# Patient Record
Sex: Male | Born: 2000 | Hispanic: No | Marital: Single | Smoking: Never smoker
Health system: Southern US, Community
[De-identification: ages and names within clinical notes are randomized; demographics above are authoritative.]

## PROBLEM LIST (undated history)

## (undated) DIAGNOSIS — S022XXA Fracture of nasal bones, initial encounter for closed fracture: Secondary | ICD-10-CM

## (undated) DIAGNOSIS — K219 Gastro-esophageal reflux disease without esophagitis: Secondary | ICD-10-CM

## (undated) DIAGNOSIS — J342 Deviated nasal septum: Secondary | ICD-10-CM

---
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
## 2020-01-25 ENCOUNTER — Encounter: Payer: Self-pay | Admitting: Nurse Practitioner

## 2020-01-25 ENCOUNTER — Ambulatory Visit: Payer: Medicaid Other | Attending: Nurse Practitioner | Admitting: Nurse Practitioner

## 2020-01-25 ENCOUNTER — Other Ambulatory Visit: Payer: Self-pay

## 2020-01-25 VITALS — BP 114/63 | HR 64 | Temp 97.7°F | Ht 68.0 in | Wt 146.0 lb

## 2020-01-25 DIAGNOSIS — Z0000 Encounter for general adult medical examination without abnormal findings: Secondary | ICD-10-CM

## 2020-01-25 DIAGNOSIS — M542 Cervicalgia: Secondary | ICD-10-CM

## 2020-01-25 DIAGNOSIS — M25519 Pain in unspecified shoulder: Secondary | ICD-10-CM | POA: Diagnosis not present

## 2020-01-25 DIAGNOSIS — J342 Deviated nasal septum: Secondary | ICD-10-CM

## 2020-01-25 MED ORDER — METHOCARBAMOL 500 MG PO TABS: 500.0000 mg | ORAL_TABLET | Freq: Three times a day (TID) | ORAL | 0 refills | Status: AC | PRN

## 2020-01-25 MED ORDER — IBUPROFEN 600 MG PO TABS: 600.0000 mg | ORAL_TABLET | Freq: Four times a day (QID) | ORAL | 0 refills | Status: DC | PRN

## 2020-01-25 MED ORDER — FLUTICASONE PROPIONATE 50 MCG/ACT NA SUSP: 2.0000 | Freq: Every day | NASAL | 6 refills | Status: DC

## 2020-01-25 NOTE — Progress Notes (Signed)
Assessment & Plan:  Roy Zavala was seen today for annual exam.  Diagnoses and all orders for this visit:  Encounter for annual physical exam  Deviated nasal septum -     Ambulatory referral to ENT -     CT Maxillofacial WO CM; Future -     fluticasone (FLONASE) 50 MCG/ACT nasal spray; Place 2 sprays into both nostrils daily.  Neck and shoulder pain -     methocarbamol (ROBAXIN) 500 MG tablet; Take 1 tablet (500 mg total) by mouth every 8 (eight) hours as needed for muscle spasms. -     ibuprofen (ADVIL) 600 MG tablet; Take 1 tablet (600 mg total) by mouth every 6 (six) hours as needed. Recommended professional massage therapy   Patient has been counseled on age-appropriate routine health concerns for screening and prevention. These are reviewed and up-to-date. Referrals have been placed accordingly. Immunizations are up-to-date or declined.    Subjective:   Chief Complaint  Patient presents with  . Annual Exam    Pt. is here for a physical. Pt. stated he is having right side neck pain that radiates down to his back. Pt. stated he had a nasal and facial surgery last year, and it's hard for him to breathe at night.    HPI Roy Zavala 19 y.o. male presents to office today for physical.  He has a history of nasal trauma and underwent closed reduction of nasal suture on 12-29-2016.   PER ENT NOTE On 05-20-2017  The patient has continued to have difficulty with left-sided nasal breathing, discolored nasal discharge and postnasal discharge with cough. No further trauma. His nasal congestion is significantly worse at night.The patient returns for follow-up appointment with continued symptoms of nasal airway obstruction, severe congestion and discolored postnasal discharge. Findings on exam are consistent with continued septal deviation and acute sinusitis. She prescribed Augmentin 500 twice daily times 10 days. He will use saline nasal spray and fluticasone. He will follow-up in 1 month for  recheck with CT scan of the sinuses first to rule out any further issues related to his prior injury and any chronic sinusitis.  Last week he states that he was playing soccer and got hit in the forehead and bridge of the nose. Had some bleeding but only on the tissue when he wiped. There was no profuse bleeding.    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 Onset of symptoms a few months ago, stable since that time. Current symptoms are aching and stiffness in the cervical region and bilateral trapezius. Patient denies occipital headaches. Patient has had no prior neck problems.  Previous treatments include: none.   Review of Systems  Constitutional: Negative for fever, malaise/fatigue and weight loss.  HENT: Positive for congestion (chronic nasal congestion). Negative for nosebleeds.   Eyes: Negative.  Negative for blurred vision, double vision and photophobia.  Respiratory: Negative.  Negative for cough and shortness of breath.   Cardiovascular: Negative.  Negative for chest pain, palpitations and leg swelling.  Gastrointestinal: Negative.  Negative for heartburn, nausea and vomiting.  Genitourinary: Negative.   Musculoskeletal: Positive for myalgias and neck pain.  Skin: Negative.   Neurological: Negative.  Negative for dizziness, focal weakness, seizures and headaches.  Endo/Heme/Allergies: Negative.   Psychiatric/Behavioral: Negative.  Negative for suicidal ideas.    Past  Medical History:  Diagnosis Date  . Acid reflux    occasional - no current med.  . Deviated septum 12/2016  . Nasal fracture 12/2016   nasal congestion due to fx., per father    Past Surgical History:  Procedure Laterality Date  . CLOSED REDUCTION NASAL FRACTURE N/A 12/29/2016   Procedure: CLOSED  REDUCTION NASAL FRACTURE WITH EXTERNAL FIXATION;  Surgeon: Osborn Coho, MD;  Location: [facility];  Service: ENT;  Laterality: N/A;    Family History  Problem Relation Age of Onset  . Hypertension Mother   . Diabetes Father   . Hearing loss Brother        due to trauma    Social History Reviewed with no changes to be made today.   Outpatient Medications Prior to Visit  Medication Sig Dispense Refill  . acetaminophen (TYLENOL) 500 MG tablet Take 1 tablet (500 mg total) by mouth every 6 (six) hours as needed. 30 tablet 0  . benzocaine (ORAJEL) 10 % mucosal gel Use as directed 1 application in the mouth or throat 4 (four) times daily as needed for mouth pain. Apply to mouth sore before meals (Patient not taking: Reported on 11/27/2019) 5.3 g 0  . benzonatate (TESSALON) 100 MG capsule Take 1 capsule (100 mg total) by mouth 3 (three) times daily as needed for cough. (Patient not taking: Reported on 11/27/2019) 20 capsule 0  . cetirizine (ZYRTEC) 10 MG tablet Take 1 tablet (10 mg total) by mouth daily. (Patient not taking: Reported on 11/27/2019) 30 tablet 2  . clindamycin-benzoyl peroxide (BENZACLIN) gel Apply topically daily. For acne (Patient not taking: Reported on 11/27/2019) 50 g 0  . ibuprofen (ADVIL) 600 MG tablet Take 1 tablet (600 mg total) by mouth every 6 (six) hours as needed. (Patient not taking: Reported on 11/27/2019) 30 tablet 0  . phenol (CHLORASEPTIC) 1.4 % LIQD Use as directed 1 spray in the mouth or throat as needed for throat irritation / pain. (Patient not taking: Reported on 11/27/2019) 29 mL 0  . tretinoin (RETIN-A) 0.05 % cream Apply topically at bedtime. (Patient not taking: Reported on 11/27/2019) 45 g 6   No facility-administered medications prior to visit.    No Known Allergies     Objective:    BP 114/63 (BP Location: Right Arm, Patient Position: Sitting, Cuff Size: Normal)   Pulse 64   Temp 97.7 F (36.5 C) (Temporal)   Ht 5\' 8"  (1.727 m)    Wt 146 lb (66.2 kg)   SpO2 95%   BMI 22.20 kg/m  Wt Readings from Last 3 Encounters:  01/25/20 146 lb (66.2 kg) (35 %, Z= -0.38)*  11/27/19 138 lb (62.6 kg) (23 %, Z= -0.74)*  05/10/19 140 lb (63.5 kg) (29 %, Z= -0.56)*   * Growth percentiles are based on CDC (Boys, 2-20 Years) data.    Physical Exam Vitals and nursing note reviewed.  Constitutional:      Appearance: He is well-developed.  HENT:     Head: Normocephalic and atraumatic.     Right Ear: Hearing, tympanic membrane, ear canal and external ear normal.     Left Ear: Hearing, tympanic membrane, ear canal and external ear normal.     Nose: Nasal deformity, septal deviation and mucosal edema present. No rhinorrhea.     Mouth/Throat:     Pharynx: Uvula midline.     Tonsils: No tonsillar exudate. 1+ on the right. 1+ on the left.  Eyes:     General:  Lids are normal. No scleral icterus.    Conjunctiva/sclera: Conjunctivae normal.     Pupils: Pupils are equal, round, and reactive to light.  Neck:     Thyroid: No thyromegaly.     Trachea: No tracheal deviation.  Cardiovascular:     Rate and Rhythm: Normal rate and regular rhythm.     Heart sounds: Normal heart sounds. No murmur heard.  No friction rub. No gallop.   Pulmonary:     Effort: Pulmonary effort is normal. No tachypnea or respiratory distress.     Breath sounds: Normal breath sounds. No decreased breath sounds, wheezing, rhonchi or rales.  Chest:     Chest wall: No mass or tenderness.     Breasts:        Right: No inverted nipple, mass, nipple discharge, skin change or tenderness.        Left: No inverted nipple, mass, nipple discharge, skin change or tenderness.  Abdominal:     General: Bowel sounds are normal. There is no distension.     Palpations: Abdomen is soft. There is no mass.     Tenderness: There is no abdominal tenderness. There is no guarding or rebound.     Hernia: There is no hernia in the left inguinal area.  Genitourinary:    Penis: Normal.        Testes: Normal.        Right: Mass, tenderness or swelling not present. Right testis is descended. Cremasteric reflex is present.         Left: Mass, tenderness or swelling not present. Left testis is descended. Cremasteric reflex is present.   Musculoskeletal:        General: No tenderness or deformity. Normal range of motion.     Cervical back: Normal range of motion and neck supple. Muscular tenderness present.  Lymphadenopathy:     Cervical: No cervical adenopathy.     Lower Body: No right inguinal adenopathy. No left inguinal adenopathy.  Skin:    General: Skin is warm and dry.     Capillary Refill: Capillary refill takes less than 2 seconds.     Findings: No erythema.  Neurological:     Mental Status: He is alert and oriented to person, place, and time.     Cranial Nerves: No cranial nerve deficit.     Motor: No abnormal muscle tone.     Coordination: Coordination normal.     Deep Tendon Reflexes: Reflexes normal.  Psychiatric:        Behavior: Behavior normal. Behavior is cooperative.        Thought Content: Thought content normal.        Judgment: Judgment normal.          Patient has been counseled extensively about nutrition and exercise as well as the importance of adherence with medications and regular follow-up. The patient was given clear instructions to go to ER or return to medical center if symptoms don't improve, worsen or new problems develop. The patient verbalized understanding.   Follow-up: Return if symptoms worsen or fail to improve.   Claiborne Rigg, FNP-BC [facility] and [facility] [place], [place] [phone]   01/27/2020,

## 2020-01-25 NOTE — Patient Instructions (Addendum)
[facility] [phone] [address] [place], [place] [address] [email or web address] Phone: [phone]

## 2020-01-27 ENCOUNTER — Encounter: Payer: Self-pay | Admitting: Nurse Practitioner

## 2020-02-06 ENCOUNTER — Telehealth: Payer: Self-pay

## 2020-02-06 NOTE — Telephone Encounter (Signed)
This pt has been approved for CPT code: 30865 (CT Maxillofacial w/o contrast). Auth code: [id number]. Auth good 02/06/20-03/22/20.

## 2020-02-08 ENCOUNTER — Ambulatory Visit (HOSPITAL_COMMUNITY): Payer: Medicaid Other

## 2020-02-15 ENCOUNTER — Ambulatory Visit (HOSPITAL_COMMUNITY)
Admission: RE | Admit: 2020-02-15 | Discharge: 2020-02-15 | Disposition: A | Discharge status: Home / Self Care | Payer: Medicaid Other | Source: Ambulatory Visit | Attending: Nurse Practitioner | Admitting: Nurse Practitioner

## 2020-02-15 DIAGNOSIS — J342 Deviated nasal septum: Secondary | ICD-10-CM | POA: Diagnosis present

## 2020-02-21 DIAGNOSIS — J343 Hypertrophy of nasal turbinates: Secondary | ICD-10-CM | POA: Diagnosis not present

## 2020-02-21 DIAGNOSIS — J342 Deviated nasal septum: Secondary | ICD-10-CM | POA: Diagnosis not present

## 2020-02-21 DIAGNOSIS — J310 Chronic rhinitis: Secondary | ICD-10-CM | POA: Diagnosis not present

## 2020-03-30 ENCOUNTER — Other Ambulatory Visit: Payer: Self-pay

## 2020-03-30 ENCOUNTER — Emergency Department (HOSPITAL_COMMUNITY)
Admission: EM | Admit: 2020-03-30 | Discharge: 2020-03-30 | Disposition: A | Discharge status: Home / Self Care | Payer: Medicaid Other | Attending: Emergency Medicine | Admitting: Emergency Medicine

## 2020-03-30 ENCOUNTER — Encounter (HOSPITAL_COMMUNITY): Payer: Self-pay | Admitting: Emergency Medicine

## 2020-03-30 DIAGNOSIS — B86 Scabies: Secondary | ICD-10-CM | POA: Insufficient documentation

## 2020-03-30 DIAGNOSIS — R21 Rash and other nonspecific skin eruption: Secondary | ICD-10-CM | POA: Diagnosis present

## 2020-03-30 MED ORDER — PERMETHRIN 5 % EX CREA: TOPICAL_CREAM | CUTANEOUS | 1 refills | Status: DC

## 2020-03-30 NOTE — ED Provider Notes (Signed)
[name] Arrival date & time: 03/30/20  1517     History Chief Complaint  Patient presents with  . Rash    Roy Zavala is a 19 y.o. male.  Roy Zavala is a 19 y.o. male with a history of acid reflux, who presents to the emergency department for evaluation of rash.  Patient reports that rash showed up on 10/23 after he had stayed at a hotel in [facility].  At first he thought this was related to seafood that he had eaten, but he states that he first noticed the rash after sleeping in the hotel bed.  It started on his hands and feet and around his waist and has spread up his arms and the legs and onto his trunk he is also noticed a few lesions on his face.  He reports rash is extremely itchy, denies any pain.  No vesicles or pustules.  No fevers or chills.  No associated facial swelling, difficulty breathing.  Rash has persisted, he reports that the person he was within the hotel does not have a similar rash but slept in a different bed.  Did not see any bugs on himself.  No other aggravating or alleviating factors.        Past Medical History:  Diagnosis Date  . Acid reflux    occasional - no current med.  . Deviated septum 12/2016  . Nasal fracture 12/2016   nasal congestion due to fx., per father    Patient Active Problem List   Diagnosis Date Noted  . Severe left groin pain 09/12/2017  . Closed fracture of nasal bone 12/29/2016  . Deviated septum 12/29/2016  . Tear of left hamstring 02/11/2016  . Knee pain, bilateral 05/21/2015  . Vitamin D deficiency 09/13/2014  . Failed vision screen 03/13/2014  . Episodic lightheadedness 04/16/2013    Past Surgical History:  Procedure Laterality Date  . CLOSED REDUCTION NASAL FRACTURE N/A 12/29/2016   Procedure: CLOSED REDUCTION NASAL FRACTURE WITH EXTERNAL FIXATION;  Surgeon: Osborn Coho, MD;  Location: [facility];  Service: ENT;   Laterality: N/A;       Family History  Problem Relation Age of Onset  . Hypertension Mother   . Diabetes Father   . Hearing loss Brother        due to trauma    Social History   Tobacco Use  . Smoking status: Never Smoker  . Smokeless tobacco: Never Used  Vaping Use  . Vaping Use: Never used  Substance Use Topics  . Alcohol use: No  . Drug use: No    Home Medications Prior to Admission medications   Medication Sig Start Date End Date Taking? Authorizing Provider  acetaminophen (TYLENOL) 500 MG tablet Take 1 tablet (500 mg total) by mouth every 6 (six) hours as needed. 07/27/19   Fawze, Mina A, PA-C  fluticasone (FLONASE) 50 MCG/ACT nasal spray Place 2 sprays into both nostrils daily. 01/25/20   Claiborne Rigg, NP  ibuprofen (ADVIL) 600 MG tablet Take 1 tablet (600 mg total) by mouth every 6 (six) hours as needed. 01/25/20   Claiborne Rigg, NP  permethrin Verner Mould) 5 % cream Apply to affected area once 03/30/20   Dartha Lodge, PA-C    Allergies    Patient has no known allergies.  Review of Systems   Review of Systems  Constitutional: Negative for chills and fever.  HENT: Negative for facial swelling.  Skin: Positive for rash.    Physical Exam Updated Vital Signs BP 127/81 (BP Location: Left Arm)   Pulse (!) 56   Temp 98.4 F (36.9 C) (Oral)   Resp 16   SpO2 93%   Physical Exam Vitals and nursing note reviewed.  Constitutional:      General: He is not in acute distress.    Appearance: Normal appearance. He is well-developed. He is not ill-appearing or diaphoretic.     Comments: Well-appearing and in no distress  HENT:     Head: Normocephalic and atraumatic.     Mouth/Throat:     Mouth: Mucous membranes are moist.     Pharynx: Oropharynx is clear.     Comments: No mucosal lesions Eyes:     General:        Right eye: No discharge.        Left eye: No discharge.  Cardiovascular:     Rate and Rhythm: Normal rate and regular rhythm.     Heart  sounds: Normal heart sounds.  Pulmonary:     Effort: Pulmonary effort is normal. No respiratory distress.     Breath sounds: Normal breath sounds.  Musculoskeletal:     Cervical back: Neck supple.  Skin:    General: Skin is warm and dry.     Comments: Rash consisting of small erythematous papular lesions spreading from the hands and feet up the extremities, patient also with lesions on the trunk, few lesions to the forehead and neck noted as well.  Several of these lesions are in a linear formation.  Rash consistent with scabies  Neurological:     Mental Status: He is alert.     Coordination: Coordination normal.  Psychiatric:        Mood and Affect: Mood normal.        Behavior: Behavior normal.     ED Results / Procedures / Treatments   Labs (all labs ordered are listed, but only abnormal results are displayed) Labs Reviewed - No data to display  EKG None  Radiology No results found.  Procedures Procedures (including critical care time)  Medications Ordered in ED Medications - No data to display  ED Course  I have reviewed the triage vital signs and the nursing notes.  Pertinent labs & imaging results that were available during my care of the patient were reviewed by me and considered in my medical decision making (see chart for details).    MDM Rules/Calculators/A&P                         Discussed diagnosis & treatment of scabies with patient.  Advised to followup with her primary care doctor 2 weeks after treatment.  They have also been advised to clean entire household including washing sheets and using Nix spray On things that cannot be washed.   The use of permethrin cream was discussed as well, told to use cream from head to toe & leave on  for 8-12 hours.  Pt advised to repeat treatment if new eruptions occur. Patient verbalized understanding.  Discharged home in good condition.  Final Clinical Impression(s) / ED Diagnoses Final diagnoses:  Scabies    Rx /  DC Orders ED Discharge Orders         Ordered    permethrin (ELIMITE) 5 % cream        03/30/20 1836           Dartha Lodge, PA-C  03/30/20 2331    Little, Ambrose Finland, MD 03/31/20 1443

## 2020-03-30 NOTE — ED Triage Notes (Signed)
C/o itchy bumps all over since staying in a hotel in [facility] this past week.

## 2020-03-30 NOTE — Discharge Instructions (Addendum)
Wash all clothing & linens in hot water.  Spray all upholstered surfaces with Nix spray.  You should treat yourself with the prescribed cream as well as anyone else who lives in the house with you and was also in the same hotel.  Be sure the cream is on the skin for at least 8 hours.  Most people sleep in it over night & wash it off in the morning. You may repeat treatment in 1 week if there is no improvement.

## 2020-03-31 ENCOUNTER — Telehealth: Payer: Self-pay

## 2020-03-31 DIAGNOSIS — Z01818 Encounter for other preprocedural examination: Secondary | ICD-10-CM | POA: Diagnosis not present

## 2020-03-31 NOTE — Telephone Encounter (Signed)
Transition Care Management Follow-up Telephone Call  Date of discharge and from where: 03/30/2020 [facility]  How have you been since you were released from the hospital? Doing better,   Any questions or concerns? No  Items Reviewed:  Did the pt receive and understand the discharge instructions provided? Yes   Medications obtained and verified? Yes   Other? No   Any new allergies since your discharge? No   Dietary orders reviewed? Yes  Do you have support at home? Yes   Home Care and Equipment/Supplies: Were home health services ordered? not applicable If so, what is the name of the agency? na  Has the agency set up a time to come to the patient's home? not applicable Were any new equipment or medical supplies ordered?  No What is the name of the medical supply agency? na Were you able to get the supplies/equipment? not applicable Do you have any questions related to the use of the equipment or supplies? No  Functional Questionnaire: (I = Independent and D = Dependent) ADLs: I  Bathing/Dressing- I  Meal Prep- I  Eating- I  Maintaining continence- I  Transferring/Ambulation- I  Managing Meds- I  Follow up appointments reviewed:   PCP Hospital f/u appt confirmed? No    Specialist Hospital f/u appt confirmed? No    Are transportation arrangements needed? No   If their condition worsens, is the pt aware to call PCP or go to the Emergency Dept.? Yes  Was the patient provided with contact information for the PCP's office or ED? Yes  Was to pt encouraged to call back with questions or concerns? Yes

## 2020-04-03 DIAGNOSIS — J343 Hypertrophy of nasal turbinates: Secondary | ICD-10-CM | POA: Diagnosis not present

## 2020-04-03 DIAGNOSIS — J3489 Other specified disorders of nose and nasal sinuses: Secondary | ICD-10-CM | POA: Diagnosis not present

## 2020-04-03 DIAGNOSIS — J342 Deviated nasal septum: Secondary | ICD-10-CM | POA: Diagnosis not present

## 2020-04-04 DIAGNOSIS — L7622 Postprocedural hemorrhage and hematoma of skin and subcutaneous tissue following other procedure: Secondary | ICD-10-CM | POA: Diagnosis not present

## 2020-05-01 ENCOUNTER — Ambulatory Visit (INDEPENDENT_AMBULATORY_CARE_PROVIDER_SITE_OTHER): Payer: Medicaid Other | Admitting: Family Medicine

## 2020-05-01 ENCOUNTER — Other Ambulatory Visit: Payer: Self-pay

## 2020-05-01 DIAGNOSIS — M545 Low back pain, unspecified: Secondary | ICD-10-CM | POA: Diagnosis not present

## 2020-05-01 MED ORDER — MELOXICAM 15 MG PO TABS: 7.5000 mg | ORAL_TABLET | Freq: Every day | ORAL | 6 refills | Status: DC | PRN

## 2020-05-01 NOTE — Progress Notes (Signed)
Office Visit Note   Patient: Roy Zavala           Date of Birth: Jun 11, 2000           MRN: [id number] Visit Date: 05/01/2020 Requested by: Claiborne Rigg, NP [address] [place],  [place] [address] PCP: Claiborne Rigg, NP  Subjective: Chief Complaint  Patient presents with  . Lower Back - Pain    Pain in middle of lower back x 1 month - constant. Unsure of exact injury, but plays soccer and works out with [employer]. Notices pain mostly with bending forward at the waist and with picking up objects, as well as with soccer.   . Left Shoulder - Pain    Pain in posterior shoulder - "tightness" in the muscles with playing sports x 1 month. Tried stretching and using icy/hot otc. Pain and tightness is still there.    HPI: He is here with low back pain.  Symptoms started about a month ago.  He plays competitive soccer and also lifts weights.  He is not sure exactly what caused his pain but he has had some pain during both activities.  Pain does not radiate down the legs but occasionally radiates upward toward his thoracic area.  The pain seems to be in the midline more in the lumbar region.  No bowel or bladder dysfunction.  He has tried stretching at home with some temporary relief.  He is not taking medications, he has never had problems with his back before.                ROS:   All other systems were reviewed and are negative.  Objective: Vital Signs: There were no vitals taken for this visit.  Physical Exam:  General:  Alert and oriented, in no acute distress. Pulm:  Breathing unlabored. Psy:  Normal mood, congruent affect. Skin: No rash Low back: He has a tender trigger point to the right of midline at about 1-2.  He is also tender in the lumbosacral area but not severely.  Stork test is equivocal, straight leg raise is negative.  No tenderness over the SI joint.  Lower extremity strength and reflexes are normal.    Imaging: No results found.  Assessment & Plan: 1.   Acute low back pain, suspect myofascial pain -Referral to [facility] PT.  Meloxicam as needed.  X-rays and MRI scan if he fails to improve.     Procedures: No procedures performed        PMFS History: Patient Active Problem List   Diagnosis Date Noted  . Severe left groin pain 09/12/2017  . Closed fracture of nasal bone 12/29/2016  . Deviated septum 12/29/2016  . Tear of left hamstring 02/11/2016  . Knee pain, bilateral 05/21/2015  . Vitamin D deficiency 09/13/2014  . Failed vision screen 03/13/2014  . Episodic lightheadedness 04/16/2013   Past Medical History:  Diagnosis Date  . Acid reflux    occasional - no current med.  . Deviated septum 12/2016  . Nasal fracture 12/2016   nasal congestion due to fx., per father    Family History  Problem Relation Age of Onset  . Hypertension Mother   . Diabetes Father   . Hearing loss Brother        due to trauma    Past Surgical History:  Procedure Laterality Date  . CLOSED REDUCTION NASAL FRACTURE N/A 12/29/2016   Procedure: CLOSED REDUCTION NASAL FRACTURE WITH EXTERNAL FIXATION;  Surgeon: Osborn Coho,  MD;  Location: [facility];  Service: ENT;  Laterality: N/A;   Social History   Occupational History  . Not on file  Tobacco Use  . Smoking status: Never Smoker  . Smokeless tobacco: Never Used  Vaping Use  . Vaping Use: Never used  Substance and Sexual Activity  . Alcohol use: No  . Drug use: No  . Sexual activity: Never

## 2020-05-14 DIAGNOSIS — Z20822 Contact with and (suspected) exposure to covid-19: Secondary | ICD-10-CM | POA: Diagnosis not present

## 2020-08-11 ENCOUNTER — Ambulatory Visit: Payer: Medicaid Other | Admitting: Family Medicine

## 2020-08-14 ENCOUNTER — Ambulatory Visit: Payer: Medicaid Other | Admitting: Family Medicine

## 2020-08-25 ENCOUNTER — Other Ambulatory Visit: Payer: Self-pay

## 2020-08-25 ENCOUNTER — Ambulatory Visit (INDEPENDENT_AMBULATORY_CARE_PROVIDER_SITE_OTHER): Payer: Medicaid Other | Admitting: Family Medicine

## 2020-08-25 ENCOUNTER — Encounter: Payer: Self-pay | Admitting: Family Medicine

## 2020-08-25 DIAGNOSIS — G8929 Other chronic pain: Secondary | ICD-10-CM | POA: Diagnosis not present

## 2020-08-25 DIAGNOSIS — M545 Low back pain, unspecified: Secondary | ICD-10-CM | POA: Diagnosis not present

## 2020-08-25 MED ORDER — DICLOFENAC SODIUM 75 MG PO TBEC: 75.0000 mg | DELAYED_RELEASE_TABLET | Freq: Two times a day (BID) | ORAL | 3 refills | Status: DC | PRN

## 2020-08-25 MED ORDER — TIZANIDINE HCL 2 MG PO TABS: 2.0000 mg | ORAL_TABLET | Freq: Four times a day (QID) | ORAL | 1 refills | Status: DC | PRN

## 2020-08-25 NOTE — Progress Notes (Signed)
   Office Visit Note   Patient: Roy Zavala           Date of Birth: [birth year]           MRN: [id number] Visit Date: 08/25/2020 Requested by: Claiborne Rigg, NP [address] [place],  [place] [address] PCP: Claiborne Rigg, NP  Subjective: Chief Complaint  Patient presents with  . Lower Back - Pain    Continues to have pain in the lower back. Constant. Cannot bend over and stretch. Has not been playing any sports.     HPI: He is here with persistent back pain.  He is actually gotten worse since last visit.  He is doing a lot of home exercises but nothing seems to help, now he cannot bend forward when standing, or sits on the floor with his family.  He has to sleep with a towel underneath his back.  No radicular pain.  Meloxicam did not help.               ROS:   All other systems were reviewed and are negative.  Objective: Vital Signs: There were no vitals taken for this visit.  Physical Exam:  General:  Alert and oriented, in no acute distress. Pulm:  Breathing unlabored. Psy:  Normal mood, congruent affect.  Low back: He is tender now near the L4-5 level in the paraspinous muscles and a little bit over the spinous processes.  Straight leg raise negative, lower extremity strength and reflexes remain normal.   Imaging: No results found.  Assessment & Plan: 1.  Worsening low back pain -Discussed options and elected to proceed with x-rays and MRI scan.  Depending on the results, consider physical therapy. -Called in diclofenac and tizanidine to take as needed.     Procedures: No procedures performed        PMFS History: Patient Active Problem List   Diagnosis Date Noted  . Severe left groin pain 09/12/2017  . Acute recurrent pansinusitis 05/20/2017  . Rhinitis, chronic 05/20/2017  . Closed fracture of nasal bone 12/29/2016  . Deviated septum 12/29/2016  . Tear of left hamstring 02/11/2016  . Knee pain, bilateral 05/21/2015  . Vitamin D deficiency  09/13/2014  . Failed vision screen 03/13/2014  . Episodic lightheadedness 04/16/2013   Past Medical History:  Diagnosis Date  . Acid reflux    occasional - no current med.  . Deviated septum 12/2016  . Nasal fracture 12/2016   nasal congestion due to fx., per father    Family History  Problem Relation Age of Onset  . Hypertension Mother   . Diabetes Father   . Hearing loss Brother        due to trauma    Past Surgical History:  Procedure Laterality Date  . CLOSED REDUCTION NASAL FRACTURE N/A 12/29/2016   Procedure: CLOSED REDUCTION NASAL FRACTURE WITH EXTERNAL FIXATION;  Surgeon: Osborn Coho, MD;  Location: [facility];  Service: ENT;  Laterality: N/A;   Social History   Occupational History  . Not on file  Tobacco Use  . Smoking status: Never Smoker  . Smokeless tobacco: Never Used  Vaping Use  . Vaping Use: Never used  Substance and Sexual Activity  . Alcohol use: No  . Drug use: No  . Sexual activity: Never

## 2020-09-13 ENCOUNTER — Ambulatory Visit (HOSPITAL_BASED_OUTPATIENT_CLINIC_OR_DEPARTMENT_OTHER)
Admission: RE | Admit: 2020-09-13 | Discharge: 2020-09-13 | Disposition: A | Discharge status: Home / Self Care | Payer: Medicaid Other | Source: Ambulatory Visit | Attending: Family Medicine | Admitting: Family Medicine

## 2020-09-13 ENCOUNTER — Other Ambulatory Visit: Payer: Self-pay

## 2020-09-13 DIAGNOSIS — M545 Low back pain, unspecified: Secondary | ICD-10-CM | POA: Insufficient documentation

## 2020-09-13 DIAGNOSIS — G8929 Other chronic pain: Secondary | ICD-10-CM | POA: Diagnosis present

## 2020-09-15 ENCOUNTER — Telehealth: Payer: Self-pay | Admitting: Family Medicine

## 2020-09-15 DIAGNOSIS — G8929 Other chronic pain: Secondary | ICD-10-CM

## 2020-09-15 DIAGNOSIS — M545 Low back pain, unspecified: Secondary | ICD-10-CM

## 2020-09-15 NOTE — Telephone Encounter (Signed)
MRI looks normal.  No disc problems, bone problems, or pinched nerves.   Pain is most likely muscular.  Would suggest going to physical therapy for treatment.  Will place orders for this.

## 2020-09-15 NOTE — Telephone Encounter (Signed)
LMOM of the below message from Dr. Prince Rome

## 2020-10-01 ENCOUNTER — Ambulatory Visit: Payer: Medicaid Other | Attending: Family Medicine | Admitting: Physical Therapy

## 2020-10-01 DIAGNOSIS — M5441 Lumbago with sciatica, right side: Secondary | ICD-10-CM | POA: Insufficient documentation

## 2020-10-01 DIAGNOSIS — R29898 Other symptoms and signs involving the musculoskeletal system: Secondary | ICD-10-CM | POA: Insufficient documentation

## 2020-10-10 ENCOUNTER — Other Ambulatory Visit: Payer: Self-pay

## 2020-10-10 ENCOUNTER — Ambulatory Visit: Payer: Medicaid Other | Attending: Nurse Practitioner | Admitting: Nurse Practitioner

## 2020-10-10 ENCOUNTER — Encounter: Payer: Self-pay | Admitting: Nurse Practitioner

## 2020-10-10 DIAGNOSIS — M545 Low back pain, unspecified: Secondary | ICD-10-CM

## 2020-10-10 DIAGNOSIS — G8929 Other chronic pain: Secondary | ICD-10-CM | POA: Diagnosis not present

## 2020-10-10 DIAGNOSIS — E559 Vitamin D deficiency, unspecified: Secondary | ICD-10-CM | POA: Diagnosis not present

## 2020-10-10 MED ORDER — GABAPENTIN 600 MG PO TABS: 300.0000 mg | ORAL_TABLET | Freq: Every day | ORAL | 0 refills | Status: AC

## 2020-10-10 MED ORDER — VITAMIN D (ERGOCALCIFEROL) 1.25 MG (50000 UNIT) PO CAPS: 50000.0000 [IU] | ORAL_CAPSULE | ORAL | 1 refills | Status: AC

## 2020-10-10 MED ORDER — LIDOCAINE 5 % EX PTCH: 1.0000 | MEDICATED_PATCH | CUTANEOUS | 0 refills | Status: AC

## 2020-10-10 NOTE — Progress Notes (Signed)
Virtual Visit via Telephone Note Due to national recommendations of social distancing due to COVID 19, telehealth visit is felt to be most appropriate for this patient at this time.  I discussed the limitations, risks, security and privacy concerns of performing an evaluation and management service by telephone and the availability of in person appointments. I also discussed with the patient that there may be a patient responsible charge related to this service. The patient expressed understanding and agreed to proceed.    I connected with Roy Zavala on 10/10/20  at  10:50 AM EDT  EDT by telephone and verified that I am speaking with the correct person using two identifiers.  Location of Patient: Private Residence   Location of Provider: [facility]    Persons participating in Telemedicine visit: Bertram Denver FNP-BC Roy Zavala    History of Present Illness: Telemedicine visit for: Low back pain  He has been seeing Dr. Prince Rome with Ortho for his chronic low back pain. Imaging did not show any specific abnormalities that should be causing his reported pain. Today he states his pain has not improved. He will start seeing PT  next week. Onset 6 months ago. Treatments tried: yoga, massage, stretching, OTC NSAIDs, meloxicam with little relief of pain.   He has not been able to play competitive soccer since his pain started. Pain does not radiate down the legs but occasionally radiates upward toward his thoracic area.  The pain seems to be in the midline more in the lumbar region.  No bowel or bladder dysfunction.    Past Medical History:  Diagnosis Date   Acid reflux    occasional - no current med.   Deviated septum 12/2016   Nasal fracture 12/2016   nasal congestion due to fx., per father    Past Surgical History:  Procedure Laterality Date   CLOSED REDUCTION NASAL FRACTURE N/A 12/29/2016   Procedure: CLOSED REDUCTION NASAL FRACTURE WITH EXTERNAL  FIXATION;  Surgeon: Osborn Coho, MD;  Location: [facility];  Service: ENT;  Laterality: N/A;    Family History  Problem Relation Age of Onset   Hypertension Mother    Diabetes Father    Hearing loss Brother        due to trauma    Social History   Socioeconomic History   Marital status: Single    Spouse name: Not on file   Number of children: Not on file   Years of education: Not on file   Highest education level: Not on file  Occupational History   Not on file  Tobacco Use   Smoking status: Never   Smokeless tobacco: Never  Vaping Use   Vaping Use: Never used  Substance and Sexual Activity   Alcohol use: No   Drug use: No   Sexual activity: Never  Other Topics Concern   Not on file  Social History Narrative   Not on file   Social Determinants of Health   Financial Resource Strain: Not on file  Food Insecurity: Not on file  Transportation Needs: Not on file  Physical Activity: Not on file  Stress: Not on file  Social Connections: Not on file     Observations/Objective: Awake, alert and oriented x 3   Review of Systems  Constitutional:  Negative for fever, malaise/fatigue and weight loss.  HENT: Negative.  Negative for nosebleeds.   Eyes: Negative.  Negative for blurred vision, double vision and photophobia.  Respiratory: Negative.  Negative for  cough and shortness of breath.   Cardiovascular: Negative.  Negative for chest pain, palpitations and leg swelling.  Gastrointestinal: Negative.  Negative for heartburn, nausea and vomiting.  Musculoskeletal:  Positive for back pain and myalgias.  Neurological: Negative.  Negative for dizziness, focal weakness, seizures and headaches.  Psychiatric/Behavioral: Negative.  Negative for suicidal ideas.    Assessment and Plan: Diagnoses and all orders for this visit:  Chronic bilateral low back pain without sciatica -     lidocaine (LIDODERM) 5 %; Place 1 patch onto the skin daily. Remove & Discard  patch within 12 hours or as directed by MD -     gabapentin (NEURONTIN) 600 MG tablet; Take 0.5 tablets (300 mg total) by mouth at bedtime. TAKE ONLY HALF A TABLET Follow up with physical therapy as scheduled. If no improvement will need f/u with ortho. Would like new referral  Vitamin D deficiency -     Vitamin D, Ergocalciferol, (DRISDOL) 1.25 MG (50000 UNIT) CAPS capsule; Take 1 capsule (50,000 Units total) by mouth every 7 (seven) days.    Follow Up Instructions Return if symptoms worsen or fail to improve.     I discussed the assessment and treatment plan with the patient. The patient was provided an opportunity to ask questions and all were answered. The patient agreed with the plan and demonstrated an understanding of the instructions.   The patient was advised to call back or seek an in-person evaluation if the symptoms worsen or if the condition fails to improve as anticipated.  I provided 15 minutes of non-face-to-face time during this encounter including median intraservice time, reviewing previous notes, labs, imaging, medications and explaining diagnosis and management.  Claiborne Rigg, FNP-BC

## 2020-10-14 ENCOUNTER — Encounter: Payer: Self-pay | Admitting: Physical Therapy

## 2020-10-14 ENCOUNTER — Ambulatory Visit: Payer: Medicaid Other | Admitting: Physical Therapy

## 2020-10-14 ENCOUNTER — Other Ambulatory Visit: Payer: Self-pay

## 2020-10-14 DIAGNOSIS — M5441 Lumbago with sciatica, right side: Secondary | ICD-10-CM

## 2020-10-14 DIAGNOSIS — R29898 Other symptoms and signs involving the musculoskeletal system: Secondary | ICD-10-CM

## 2020-10-14 NOTE — Therapy (Addendum)
[facility] [address]  [place], [place], [address] Phone: [phone]   Fax:  [phone]  Physical Therapy Evaluation  Patient Details  Name: Roy Zavala MRN: [id number] Date of Birth: 10/10/2000 Referring Provider (PT): Eunice Blase, MD   Encounter Date: 10/14/2020   PT End of Session - 10/14/20 1548     Visit Number 1    Number of Visits 13    Date for PT Re-Evaluation 11/25/20    Authorization Type [employer] Medicaid    PT Start Time 1194    PT Stop Time 1537    PT Time Calculation (min) 48 min    Activity Tolerance Patient limited by pain;Patient tolerated treatment well    Behavior During Therapy [facility] for tasks assessed/performed             Past Medical History:  Diagnosis Date   Acid reflux    occasional - no current med.   Deviated septum 12/2016   Nasal fracture 12/2016   nasal congestion due to fx., per father    Past Surgical History:  Procedure Laterality Date   CLOSED REDUCTION NASAL FRACTURE N/A 12/29/2016   Procedure: CLOSED REDUCTION NASAL FRACTURE WITH EXTERNAL FIXATION;  Surgeon: Jerrell Belfast, MD;  Location: [facility];  Service: ENT;  Laterality: N/A;    There were no vitals filed for this visit.    Subjective Assessment - 10/14/20 1451     Subjective Patient reports 2 months of LBP without specific injury.  Starting last week he noticed radiation down his R leg to the knee. Notes some N/T in the R buttock. Denies B&B changes. Notes that when he bends forward he has trouble getting back up, unable to do yoga, or sprint. Better with muscle relaxants and laying prone. Reports that he had a soccer game yesterday and when he pivoted to get the ball, felt a pop in his back and felt that he could no longer bend his back forward.    Pertinent History none    Limitations Sitting;Lifting;House hold activities    How long can you sit comfortably? 0 minutes    How long can  you stand comfortably? unlimited    How long can you walk comfortably? unlimited    Diagnostic tests 09/13/20 lumbar MRI: Normal MRI of the lumbar spine.    Patient Stated Goals decrease pain    Currently in Pain? Yes    Pain Score 9     Pain Location Back    Pain Orientation Right;Lower    Pain Descriptors / Indicators Tightness    Pain Type Acute pain                [facility] PT Assessment - 10/14/20 1458       Assessment   Medical Diagnosis Chronic LBP    Referring Provider (PT) Eunice Blase, MD    Onset Date/Surgical Date 08/14/20    Next MD Visit not scheduled    Prior Therapy yes      Precautions   Precautions None      Balance Screen   Has the patient fallen in the past 6 months No    Has the patient had a decrease in activity level because of a fear of falling?  No    Is the patient reluctant to leave their home because of a fear of falling?  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, now taking break d/t being injured   Leisure soccer, workout, assist father at work      Cognition   Overall Cognitive Status Within Functional Limits for tasks assessed      Sensation   Light Touch Appears Intact      Coordination   Gross Motor Movements are Fluid and Coordinated Yes      Posture/Postural Control   Posture/Postural Control No significant limitations      ROM / Strength   AROM / PROM / Strength AROM;Strength      AROM   AROM Assessment Site Lumbar    Lumbar Flexion mid shin   severe pain in midline and R LB; pushing from LEs to come back up   Lumbar Extension severely-moderately limited   severe pain in midline  and R LB   Lumbar - Right Side Bend jt line   pain   Lumbar - Left Side Bend distal thigh   pain   Lumbar - Right Rotation mildly limited    Lumbar - Left Rotation mild-moderately limited   pain     Strength   Strength Assessment Site Hip;Knee;Ankle    Right/Left Hip Right;Left    Right Hip Flexion 4/5   pain   Right Hip ABduction 4/5   R buttock pain   Right Hip ADduction 4/5   R buttock pain   Left Hip Flexion 4+/5    Left Hip ABduction 4+/5    Left Hip ADduction 4/5    Right/Left Knee Right;Left    Right Knee Flexion 4/5    Right Knee Extension 5/5    Left Knee Flexion 4-/5    Left Knee Extension 4+/5    Right/Left Ankle Right;Left    Right Ankle Dorsiflexion 4+/5    Right Ankle Plantar Flexion 5/5   20 reps   Left Ankle Dorsiflexion 4/5    Left Ankle Plantar Flexion 5/5      Flexibility   Soft Tissue Assessment /Muscle Length yes    Piriformis pain with sitting R piriformis stretching      Palpation   Spinal mobility slightly TTP and hypomobile in lumbar spine with central and R unilateral PAs    SI assessment  R PSIS & ASIS depressed    Palpation comment TTP over lower midline of lumbar spine, R PSIS; increased soft tissue restriction in R thoracolumbar paraspinals and proximal glutes, QL      Special Tests   Other special tests positive R slump test, but B negative SLR      Ambulation/Gait   Gait Pattern Step-through pattern    Ambulation Surface Level;Indoor    Gait velocity WFL                        Objective measurements completed on examination: See above findings.               PT Education - 10/14/20 1548     Education Details prognosis, POC, HEP; edu on avoiding painful activities until pain starts to improve  Person(s) Educated Patient    Methods Explanation;Demonstration;Tactile cues;Verbal cues;Handout    Comprehension Returned demonstration;Verbalized understanding              PT Short Term Goals - 10/14/20  1554       PT SHORT TERM GOAL #1   Title Patient to be independent with initial HEP.    Time 3    Period Weeks    Status New    Target Date 11/04/20               PT Long Term Goals - 10/14/20 1554       PT LONG TERM GOAL #1   Title Patient to be independent with advanced HEP.    Time 6    Period Weeks    Status New    Target Date 11/25/20      PT LONG TERM GOAL #2   Title Patient to demonstrate B LE strength >/=4+/5.    Time 6    Period Weeks    Status New    Target Date 11/25/20      PT LONG TERM GOAL #3   Title Patient to demonstrate lumbar AROM WFL and without pain limiting.    Time 6    Period Weeks    Status New    Target Date 11/25/20      PT LONG TERM GOAL #4   Title Patient to demonstrate lifting an object from the floor with 0/10 pain.    Time 6    Period Weeks    Status New    Target Date 11/25/20      PT LONG TERM GOAL #5   Title Patient to report tolerance for gym activities with modifications as needed.    Time 6    Period Weeks    Status New    Target Date 11/25/20                    Plan - 10/14/20 1549     Clinical Impression Statement Patient is a 20 y/o M presenting to OPPT with c/o acute insidious R sided LBP for the past 2 months. Notes new onset of radiation of pain down the R LE to the knee and N/T in the R buttock.  Denies B&B changes. Pain worse with forward flexion as he has trouble standing back up and with sitting. Patient is a very active soccer player and would like to return to pain-free sport. Patient today presenting with limited and painful lumbar AROM, B hip and knee weakness, pain with sitting R piriformis stretching, slightly TTP and hypomobile in lumbar spine with central and R unilateral PAs, R PSIS & ASIS depression- suggesting R innominate downslip, TTP over lower midline of lumbar spine and R PSIS, and increased soft tissue restriction in R thoracolumbar paraspinals, proximal glutes, QL.  Patient was  educated on gentle mobility HEP and encouraged to avoid activities that cause pain- patient reported understanding. Would benefit from skilled PT services 2x/week for 6 weeks to address aforementioned impairments.    Personal Factors and Comorbidities Age;Fitness;Time since onset of injury/illness/exacerbation    Examination-Activity Limitations Bend;Bed Mobility;Sleep;Carry;Transfers;Dressing;Hygiene/Grooming;Lift;Reach Overhead;Sit    Examination-Participation Restrictions Church;Cleaning;Community Activity;Shop;Driving;Yard Work;Laundry;Meal Prep    Stability/Clinical Decision Making Evolving/Moderate complexity    Clinical Decision Making Low    Rehab Potential Good    PT Frequency 2x / week    PT Duration 6 weeks    PT Treatment/Interventions ADLs/Self Care Home Management;Cryotherapy;Electrical Stimulation;Moist Heat;Therapeutic exercise;Therapeutic activities;Functional  mobility training;Stair training;Gait training;Ultrasound;Neuromuscular re-education;Patient/family education;Manual techniques;Taping;Energy conservation;Dry needling;Passive range of motion    PT Next Visit Plan assess R hip AROM; progress gentle lumbopelvic mobility work and hip/core Radiation protection practitioner and Agree with Plan of Care Patient             Patient will benefit from skilled therapeutic intervention in order to improve the following deficits and impairments:  Hypomobility, Decreased activity tolerance, Decreased strength, Pain, Increased fascial restricitons, Increased muscle spasms, Improper body mechanics, Decreased range of motion, Impaired flexibility  Visit Diagnosis: Acute right-sided low back pain with right-sided sciatica  Other symptoms and signs involving the musculoskeletal system     Problem List Patient Active Problem List   Diagnosis Date Noted   Severe left groin pain 09/12/2017   Acute recurrent pansinusitis 05/20/2017   Rhinitis, chronic 05/20/2017   Closed fracture of  nasal bone 12/29/2016   Deviated septum 12/29/2016   Tear of left hamstring 02/11/2016   Knee pain, bilateral 05/21/2015   Vitamin D deficiency 09/13/2014   Failed vision screen 03/13/2014   Episodic lightheadedness 04/16/2013     Janene Harvey, PT, DPT 10/14/20 3:59 PM    [facility] [address]  [place], [place], [address] Phone: [phone]   Fax:  [phone]  Name: Roy Zavala MRN: [id number] Date of Birth: 01/13/2001  PHYSICAL THERAPY DISCHARGE SUMMARY  Visits from Start of Care: 1  Current functional level related to goals / functional outcomes: See above clinical impression; patient did not return   Remaining deficits: See above   Education / Equipment: HEP  Plan: Patient agrees to discharge.  Patient goals were not met. Patient is being discharged due to not returning to PT.      Janene Harvey, PT, DPT 12/17/20 3:12 PM

## 2020-10-17 ENCOUNTER — Ambulatory Visit: Payer: Medicaid Other | Admitting: Physical Therapy

## 2020-10-24 ENCOUNTER — Encounter: Payer: Medicaid Other | Admitting: Physical Therapy

## 2020-10-28 ENCOUNTER — Ambulatory Visit: Payer: Medicaid Other | Admitting: Physical Therapy

## 2020-10-31 ENCOUNTER — Encounter: Payer: Medicaid Other | Admitting: Physical Therapy

## 2020-11-12 ENCOUNTER — Other Ambulatory Visit: Payer: Self-pay

## 2020-11-12 ENCOUNTER — Ambulatory Visit (HOSPITAL_BASED_OUTPATIENT_CLINIC_OR_DEPARTMENT_OTHER): Payer: Medicaid Other

## 2020-11-27 ENCOUNTER — Ambulatory Visit (INDEPENDENT_AMBULATORY_CARE_PROVIDER_SITE_OTHER): Payer: Medicaid Other | Admitting: Family Medicine

## 2020-11-27 ENCOUNTER — Other Ambulatory Visit: Payer: Self-pay

## 2020-11-27 DIAGNOSIS — M545 Low back pain, unspecified: Secondary | ICD-10-CM | POA: Diagnosis not present

## 2020-11-27 DIAGNOSIS — G8929 Other chronic pain: Secondary | ICD-10-CM | POA: Diagnosis not present

## 2020-11-27 NOTE — Progress Notes (Signed)
Office Visit Note   Patient: Roy Zavala           Date of Birth: Dec 13, 2000           MRN: [id number] Visit Date: 11/27/2020 Requested by: Claiborne Rigg, NP [address] [place],  [place] [address] PCP: Claiborne Rigg, NP  Subjective: Chief Complaint  Patient presents with   Lower Back - Pain    Continues to have pain in the right lower back with playing soccer. He says he can be ok for 15-45 minutes of play, but then the pain will start in the 2nd half. He fell twice last week due to this.     HPI: He is here for follow-up chronic back pain.  He went to 1 session of physical therapy and did not notice much difference.  He has not been working out for the past 2 months on a regular basis because of his pain.  When he does certain twisting motions, the pain dropped him to his knees.  It is primarily in the right lumbar area.              ROS:   All other systems were reviewed and are negative.  Objective: Vital Signs: There were no vitals taken for this visit.  Physical Exam:  General:  Alert and oriented, in no acute distress. Pulm:  Breathing unlabored. Psy:  Normal mood, congruent affect.  Low back: He is tender to deep palpation to the right of midline at about L4-5 level in the paraspinous muscles.  I believe he has a trigger point there that is symptomatic.  No pain over the SI joint or sciatic notch.    Imaging: No results found.  Assessment & Plan: Chronic right-sided low back pain, could be myofascial versus facet syndrome. -Elected to try chiropractic per Dr. Fredric Mare in [facility].  Could contemplate trigger point injection if symptoms persist.      Procedures: No procedures performed        PMFS History: Patient Active Problem List   Diagnosis Date Noted   Severe left groin pain 09/12/2017   Acute recurrent pansinusitis 05/20/2017   Rhinitis, chronic 05/20/2017   Closed fracture of nasal bone 12/29/2016   Deviated septum 12/29/2016   Tear of  left hamstring 02/11/2016   Knee pain, bilateral 05/21/2015   Vitamin D deficiency 09/13/2014   Failed vision screen 03/13/2014   Episodic lightheadedness 04/16/2013   Past Medical History:  Diagnosis Date   Acid reflux    occasional - no current med.   Deviated septum 12/2016   Nasal fracture 12/2016   nasal congestion due to fx., per father    Family History  Problem Relation Age of Onset   Hypertension Mother    Diabetes Father    Hearing loss Brother        due to trauma    Past Surgical History:  Procedure Laterality Date   CLOSED REDUCTION NASAL FRACTURE N/A 12/29/2016   Procedure: CLOSED REDUCTION NASAL FRACTURE WITH EXTERNAL FIXATION;  Surgeon: Osborn Coho, MD;  Location: [facility];  Service: ENT;  Laterality: N/A;   Social History   Occupational History   Not on file  Tobacco Use   Smoking status: Never   Smokeless tobacco: Never  Vaping Use   Vaping Use: Never used  Substance and Sexual Activity   Alcohol use: No   Drug use: No   Sexual activity: Never

## 2021-01-18 DIAGNOSIS — W2102XA Struck by soccer ball, initial encounter: Secondary | ICD-10-CM | POA: Diagnosis not present

## 2021-01-18 DIAGNOSIS — R1030 Lower abdominal pain, unspecified: Secondary | ICD-10-CM | POA: Diagnosis not present

## 2021-01-18 DIAGNOSIS — Y999 Unspecified external cause status: Secondary | ICD-10-CM | POA: Diagnosis not present

## 2021-01-18 DIAGNOSIS — S76212A Strain of adductor muscle, fascia and tendon of left thigh, initial encounter: Secondary | ICD-10-CM | POA: Diagnosis not present

## 2021-01-18 DIAGNOSIS — M25552 Pain in left hip: Secondary | ICD-10-CM | POA: Diagnosis not present

## 2021-01-19 ENCOUNTER — Telehealth: Payer: Self-pay

## 2021-01-19 NOTE — Telephone Encounter (Signed)
Transition Care Management Unsuccessful Follow-up Telephone Call  Date of discharge and from where:  01/18/2021-[facility]   Attempts:  1st Attempt  Reason for unsuccessful TCM follow-up call:  Unable to reach patient

## 2021-01-22 NOTE — Telephone Encounter (Signed)
Transition Care Management Unsuccessful Follow-up Telephone Call  Date of discharge and from where:  01/18/2021 from [facility]  Attempts:  2nd Attempt  Reason for unsuccessful TCM follow-up call:  Unable to leave message

## 2021-01-25 NOTE — Telephone Encounter (Signed)
Transition Care Management Unsuccessful Follow-up Telephone Call  Date of discharge and from where:  01/18/2021-[facility]  Attempts:  3rd Attempt  Reason for unsuccessful TCM follow-up call:  Unable to leave message

## 2021-01-27 NOTE — Progress Notes (Signed)
[facility] [address] [place] [address] Phone: [phone] Subjective:   Roy Zavala, am serving as a scribe for Dr. Antoine Primas. This visit occurred during the SARS-CoV-2 public health emergency.  Safety protocols were in place, including screening questions prior to the visit, additional usage of staff PPE, and extensive cleaning of exam room while observing appropriate contact time as indicated for disinfecting solutions.   I'm seeing this patient by the request  of:  Claiborne Rigg, NP  CC: right leg pain   [id number]  Was seen in 2017 for left hamstring and 2019 for left hip Roy Zavala is a 20 y.o. male coming in with complaint of right leg pain. Tore left groin 2 weeks ago. Pain in lower back on the left side. Sharp when running, shuffling, and kicking. Nothing seems to help       Past Medical History:  Diagnosis Date   Acid reflux    occasional - no current med.   Deviated septum 12/2016   Nasal fracture 12/2016   nasal congestion due to fx., per father   Past Surgical History:  Procedure Laterality Date   CLOSED REDUCTION NASAL FRACTURE N/A 12/29/2016   Procedure: CLOSED REDUCTION NASAL FRACTURE WITH EXTERNAL FIXATION;  Surgeon: Osborn Coho, MD;  Location: [facility];  Service: ENT;  Laterality: N/A;   Social History   Socioeconomic History   Marital status: Single    Spouse name: Not on file   Number of children: Not on file   Years of education: Not on file   Highest education level: Not on file  Occupational History   Not on file  Tobacco Use   Smoking status: Never   Smokeless tobacco: Never  Vaping Use   Vaping Use: Never used  Substance and Sexual Activity   Alcohol use: No   Drug use: No   Sexual activity: Never  Other Topics Concern   Not on file  Social History Narrative   Not on file   Social Determinants of Health   Financial Resource Strain: Not on file  Food  Insecurity: Not on file  Transportation Needs: Not on file  Physical Activity: Not on file  Stress: Not on file  Social Connections: Not on file   No Known Allergies Family History  Problem Relation Age of Onset   Hypertension Mother    Diabetes Father    Hearing loss Brother        due to trauma         Current Outpatient Medications (Other):    gabapentin (NEURONTIN) 600 MG tablet, Take 0.5 tablets (300 mg total) by mouth at bedtime. TAKE ONLY HALF A TABLET   lidocaine (LIDODERM) 5 %, Place 1 patch onto the skin daily. Remove & Discard patch within 12 hours or as directed by MD (Patient not taking: Reported on 10/14/2020)   tiZANidine (ZANAFLEX) 2 MG tablet, Take 1-2 tablets (2-4 mg total) by mouth every 6 (six) hours as needed for muscle spasms. (Patient not taking: Reported on 10/14/2020)   Vitamin D, Ergocalciferol, (DRISDOL) 1.25 MG (50000 UNIT) CAPS capsule, Take 1 capsule (50,000 Units total) by mouth every 7 (seven) days. (Patient not taking: Reported on 10/14/2020)    Review of Systems:  No headache, visual changes, nausea, vomiting, diarrhea, constipation, dizziness, abdominal pain, skin rash, fevers, chills, night sweats, weight loss, swollen lymph nodes, body aches, joint swelling, chest pain, shortness of breath, mood changes. POSITIVE muscle aches  Objective  Blood pressure 102/68, pulse 83, height 5\' 8"  (1.727 m), weight 143 lb (64.9 kg), SpO2 98 %.   General: No apparent distress alert and oriented x3 mood and affect normal, dressed appropriately.  HEENT: Pupils equal, extraocular movements intact  Respiratory: Patient's speak in full sentences and does not appear short of breath  Cardiovascular: No lower extremity edema, non tender, no erythema  Gait normal with good balance and coordination.  MSK: Left hip exam shows the patient does have pain with flexion as well as adduction.  Patient does have good strength noted.  Negative straight leg test.  No significant  back pain.  Limited muscular skeletal ultrasound was performed and interpreted by , M  Limited musculoskeletal ultrasound shows in the groin area but there is some mild hypoechoic changes and increased Doppler flow but no significant neovascularization and do not see anything such as avulsion of the pubic bone.  No masses appreciated in the inguinal canal. Impression: Likely groin strain.  [id number]; 15 additional minutes spent for Therapeutic exercises as stated in above notes.  This included exercises focusing on stretching, strengthening, with significant focus on eccentric aspects.   Long term goals include an improvement in range of motion, strength, endurance as well as avoiding reinjury. Patient's frequency would include in 1-2 times a day, 3-5 times a week for a duration of 6-12 weeks. Hip strengthening exercises which included:  Pelvic tilt/bracing to help with proper recruitment of the lower abs and pelvic floor muscles  Glute strengthening to properly contract glutes without over-engaging low back and hamstrings - prone hip extension and glute bridge exercises Proper stretching techniques to increase effectiveness for the hip flexors, groin, quads, piriformic and low back when appropriate   Proper technique shown and discussed handout in great detail with ATC.  All questions were discussed and answered.      Impression and Recommendations:     The above documentation has been reviewed and is accurate and complete Antoine Primas, DO

## 2021-01-28 ENCOUNTER — Ambulatory Visit: Payer: Self-pay

## 2021-01-28 ENCOUNTER — Ambulatory Visit (INDEPENDENT_AMBULATORY_CARE_PROVIDER_SITE_OTHER): Payer: Medicaid Other

## 2021-01-28 ENCOUNTER — Ambulatory Visit (INDEPENDENT_AMBULATORY_CARE_PROVIDER_SITE_OTHER): Payer: Medicaid Other | Admitting: Family Medicine

## 2021-01-28 ENCOUNTER — Other Ambulatory Visit: Payer: Self-pay

## 2021-01-28 VITALS — BP 102/68 | HR 83 | Ht 68.0 in | Wt 143.0 lb

## 2021-01-28 DIAGNOSIS — M545 Low back pain, unspecified: Secondary | ICD-10-CM

## 2021-01-28 DIAGNOSIS — R1030 Lower abdominal pain, unspecified: Secondary | ICD-10-CM | POA: Diagnosis not present

## 2021-01-28 DIAGNOSIS — M79604 Pain in right leg: Secondary | ICD-10-CM | POA: Diagnosis not present

## 2021-01-28 DIAGNOSIS — R1032 Left lower quadrant pain: Secondary | ICD-10-CM | POA: Diagnosis not present

## 2021-01-28 DIAGNOSIS — R102 Pelvic and perineal pain: Secondary | ICD-10-CM | POA: Diagnosis not present

## 2021-01-28 NOTE — Patient Instructions (Signed)
Thigh compression sleeve daily for next week and then with any working out or long walks after that for at least 2 weeks  Do prescribed exercises at least 3x a week Ice after working out See you again in 5 weeks

## 2021-01-29 ENCOUNTER — Encounter: Payer: Self-pay | Admitting: Family Medicine

## 2021-01-29 NOTE — Assessment & Plan Note (Signed)
Patient has had previously a labral pathology but it does seem to be more secondary to a groin strain this time.  We will get x-rays to make sure there is no avulsion.  No significant findings other than some mild increase in hypoechoic changes noted within the muscle but not a true tear.  Discussed with patient about icing regimen and home exercises.  Patient is to increase activity as tolerated and we discussed compression sleeve.  Home exercises given follow-up again in 4 to 6 weeks

## 2021-02-24 NOTE — Progress Notes (Deleted)
[facility] [address] [place] [address] Phone: [phone] Subjective:    I'm seeing this patient by the request  of:  Claiborne Rigg, NP  CC: Left hip pain follow-up  [id number]  01/28/2021 Patient has had previously a labral pathology but it does seem to be more secondary to a groin strain this time.  We will get x-rays to make sure there is no avulsion.  No significant findings other than some mild increase in hypoechoic changes noted within the muscle but not a true tear.  Discussed with patient about icing regimen and home exercises.  Patient is to increase activity as tolerated and we discussed compression sleeve.  Home exercises given follow-up again in 4 to 6 weeks  Updated 02/25/2021 Edman Lipsey is a 20 y.o. male coming in with complaint of left groin pain.  Xray (-)  Onset-  Location Duration-  Character- Aggravating factors- Reliving factors-  Therapies tried-  Severity-     Past Medical History:  Diagnosis Date   Acid reflux    occasional - no current med.   Deviated septum 12/2016   Nasal fracture 12/2016   nasal congestion due to fx., per father   Past Surgical History:  Procedure Laterality Date   CLOSED REDUCTION NASAL FRACTURE N/A 12/29/2016   Procedure: CLOSED REDUCTION NASAL FRACTURE WITH EXTERNAL FIXATION;  Surgeon: Osborn Coho, MD;  Location: [facility];  Service: ENT;  Laterality: N/A;   Social History   Socioeconomic History   Marital status: Single    Spouse name: Not on file   Number of children: Not on file   Years of education: Not on file   Highest education level: Not on file  Occupational History   Not on file  Tobacco Use   Smoking status: Never   Smokeless tobacco: Never  Vaping Use   Vaping Use: Never used  Substance and Sexual Activity   Alcohol use: No   Drug use: No   Sexual activity: Never  Other Topics Concern   Not on file  Social History Narrative    Not on file   Social Determinants of Health   Financial Resource Strain: Not on file  Food Insecurity: Not on file  Transportation Needs: Not on file  Physical Activity: Not on file  Stress: Not on file  Social Connections: Not on file   No Known Allergies Family History  Problem Relation Age of Onset   Hypertension Mother    Diabetes Father    Hearing loss Brother        due to trauma         Current Outpatient Medications (Other):    gabapentin (NEURONTIN) 600 MG tablet, Take 0.5 tablets (300 mg total) by mouth at bedtime. TAKE ONLY HALF A TABLET   lidocaine (LIDODERM) 5 %, Place 1 patch onto the skin daily. Remove & Discard patch within 12 hours or as directed by MD (Patient not taking: Reported on 10/14/2020)   tiZANidine (ZANAFLEX) 2 MG tablet, Take 1-2 tablets (2-4 mg total) by mouth every 6 (six) hours as needed for muscle spasms. (Patient not taking: Reported on 10/14/2020)   Vitamin D, Ergocalciferol, (DRISDOL) 1.25 MG (50000 UNIT) CAPS capsule, Take 1 capsule (50,000 Units total) by mouth every 7 (seven) days. (Patient not taking: Reported on 10/14/2020)   Reviewed prior external information including notes and imaging from  primary care provider As well as notes that were available from care everywhere and  other healthcare systems.  Past medical history, social, surgical and family history all reviewed in electronic medical record.  No pertanent information unless stated regarding to the chief complaint.   Review of Systems:  No headache, visual changes, nausea, vomiting, diarrhea, constipation, dizziness, abdominal pain, skin rash, fevers, chills, night sweats, weight loss, swollen lymph nodes, body aches, joint swelling, chest pain, shortness of breath, mood changes. POSITIVE muscle aches  Objective  There were no vitals taken for this visit.   General: No apparent distress alert and oriented x3 mood and affect normal, dressed appropriately.  HEENT: Pupils  equal, extraocular movements intact  Respiratory: Patient's speak in full sentences and does not appear short of breath  Cardiovascular: No lower extremity edema, non tender, no erythema  Gait normal with good balance and coordination.  MSK:  Limited muscular skeletal ultrasound was performed and interpreted by Antoine Primas, M      Impression and Recommendations:     The above documentation has been reviewed and is accurate and complete Judi Saa, DO

## 2021-02-25 ENCOUNTER — Ambulatory Visit: Payer: Medicaid Other | Admitting: Family Medicine

## 2021-05-12 NOTE — Progress Notes (Deleted)
[facility] [place] [facility] Phone: [phone] Subjective:    I'm seeing this patient by the request  of:  Gildardo Pounds, NP  CC:   [id number]  01/28/2021 Patient has had previously a labral pathology but it does seem to be more secondary to a groin strain this time.  We will get x-rays to make sure there is no avulsion.  No significant findings other than some mild increase in hypoechoic changes noted within the muscle but not a true tear.  Discussed with patient about icing regimen and home exercises.  Patient is to increase activity as tolerated and we discussed compression sleeve.  Home exercises given follow-up again in 4 to 6 weeks   Updated 05/13/2021 Keltin Tobar is a 21 y.o. male coming in with complaint of right hamstring and LBP.       Past Medical History:  Diagnosis Date   Acid reflux    occasional - no current med.   Deviated septum 12/2016   Nasal fracture 12/2016   nasal congestion due to fx., per father   Past Surgical History:  Procedure Laterality Date   CLOSED REDUCTION NASAL FRACTURE N/A 12/29/2016   Procedure: CLOSED REDUCTION NASAL FRACTURE WITH EXTERNAL FIXATION;  Surgeon: Jerrell Belfast, MD;  Location: [facility];  Service: ENT;  Laterality: N/A;   Social History   Socioeconomic History   Marital status: Single    Spouse name: Not on file   Number of children: Not on file   Years of education: Not on file   Highest education level: Not on file  Occupational History   Not on file  Tobacco Use   Smoking status: Never   Smokeless tobacco: Never  Vaping Use   Vaping Use: Never used  Substance and Sexual Activity   Alcohol use: No   Drug use: No   Sexual activity: Never  Other Topics Concern   Not on file  Social History Narrative   Not on file   Social Determinants of Health   Financial Resource Strain: Not on file  Food Insecurity: Not on file  Transportation  Needs: Not on file  Physical Activity: Not on file  Stress: Not on file  Social Connections: Not on file   No Known Allergies Family History  Problem Relation Age of Onset   Hypertension Mother    Diabetes Father    Hearing loss Brother        due to trauma         Current Outpatient Medications (Other):    gabapentin (NEURONTIN) 600 MG tablet, Take 0.5 tablets (300 mg total) by mouth at bedtime. TAKE ONLY HALF A TABLET   lidocaine (LIDODERM) 5 %, Place 1 patch onto the skin daily. Remove & Discard patch within 12 hours or as directed by MD (Patient not taking: Reported on 10/14/2020)   tiZANidine (ZANAFLEX) 2 MG tablet, Take 1-2 tablets (2-4 mg total) by mouth every 6 (six) hours as needed for muscle spasms. (Patient not taking: Reported on 10/14/2020)   Vitamin D, Ergocalciferol, (DRISDOL) 1.25 MG (50000 UNIT) CAPS capsule, Take 1 capsule (50,000 Units total) by mouth every 7 (seven) days. (Patient not taking: Reported on 10/14/2020)   Reviewed prior external information including notes and imaging from  primary care provider As well as notes that were available from care everywhere and other healthcare systems.  Past medical history, social, surgical and family history all reviewed in electronic medical record.  No pertanent information unless stated regarding to the chief complaint.   Review of Systems:  No headache, visual changes, nausea, vomiting, diarrhea, constipation, dizziness, abdominal pain, skin rash, fevers, chills, night sweats, weight loss, swollen lymph nodes, body aches, joint swelling, chest pain, shortness of breath, mood changes. POSITIVE muscle aches  Objective  There were no vitals taken for this visit.   General: No apparent distress alert and oriented x3 mood and affect normal, dressed appropriately.  HEENT: Pupils equal, extraocular movements intact  Respiratory: Patient's speak in full sentences and does not appear short of breath  Cardiovascular: No  lower extremity edema, non tender, no erythema  Gait normal with good balance and coordination.  MSK:  Non tender with full range of motion and good stability and symmetric strength and tone of shoulders, elbows, wrist, hip, knee and ankles bilaterally.     Impression and Recommendations:     The above documentation has been reviewed and is accurate and complete Belva Agee

## 2021-05-13 ENCOUNTER — Ambulatory Visit: Payer: Medicaid Other | Admitting: Family Medicine

## 2021-05-26 NOTE — Progress Notes (Signed)
Roy Zavala [address] [place] [address] Phone: [phone] Subjective:   Roy Zavala, am serving as a scribe for Dr. Antoine Primas. This visit occurred during the SARS-CoV-2 public health emergency.  Safety protocols were in place, including screening questions prior to the visit, additional usage of staff PPE, and extensive cleaning of exam room while observing appropriate contact time as indicated for disinfecting solutions.   I'm seeing this patient by the request  of:  Claiborne Rigg, NP  CC: Low back pain, pelvis pain follow-up  [id number]  01/28/2021 Patient has had previously a labral pathology but it does seem to be more secondary to a groin strain this time.  We will get x-rays to make sure there is no avulsion.  No significant findings other than some mild increase in hypoechoic changes noted within the muscle but not a true tear.  Discussed with patient about icing regimen and home exercises.  Patient is to increase activity as tolerated and we discussed compression sleeve.  Home exercises given follow-up again in 4 to 6 weeks  Update 05/27/2021 Roy Zavala is a 21 y.o. male coming in with complaint of R hamstring and LBP. Seen for groin pain in September 2022. Patient states hip is doing well. Pain in right thigh is radiating down to calf. Constant pain no interventions help. LBP and left hip doing well. No other complaints.       Past Medical History:  Diagnosis Date   Acid reflux    occasional - no current med.   Deviated septum 12/2016   Nasal fracture 12/2016   nasal congestion due to fx., per father   Past Surgical History:  Procedure Laterality Date   CLOSED REDUCTION NASAL FRACTURE N/A 12/29/2016   Procedure: CLOSED REDUCTION NASAL FRACTURE WITH EXTERNAL FIXATION;  Surgeon: Osborn Coho, MD;  Location: [facility];  Service: ENT;  Laterality: N/A;   Social History   Socioeconomic History    Marital status: Single    Spouse name: Not on file   Number of children: Not on file   Years of education: Not on file   Highest education level: Not on file  Occupational History   Not on file  Tobacco Use   Smoking status: Never   Smokeless tobacco: Never  Vaping Use   Vaping Use: Never used  Substance and Sexual Activity   Alcohol use: No   Drug use: No   Sexual activity: Never  Other Topics Concern   Not on file  Social History Narrative   Not on file   Social Determinants of Health   Financial Resource Strain: Not on file  Food Insecurity: Not on file  Transportation Needs: Not on file  Physical Activity: Not on file  Stress: Not on file  Social Connections: Not on file   No Known Allergies Family History  Problem Relation Age of Onset   Hypertension Mother    Diabetes Father    Hearing loss Brother        due to trauma    Current Outpatient Medications (Endocrine & Metabolic):    predniSONE (DELTASONE) 20 MG tablet, Take 2 tablets (40 mg total) by mouth daily with breakfast.      Current Outpatient Medications (Other):    gabapentin (NEURONTIN) 600 MG tablet, Take 0.5 tablets (300 mg total) by mouth at bedtime. TAKE ONLY HALF A TABLET   lidocaine (LIDODERM) 5 %, Place 1 patch onto the skin daily. Remove &  Discard patch within 12 hours or as directed by MD (Patient not taking: Reported on 10/14/2020)   tiZANidine (ZANAFLEX) 2 MG tablet, Take 1-2 tablets (2-4 mg total) by mouth every 6 (six) hours as needed for muscle spasms. (Patient not taking: Reported on 10/14/2020)   Vitamin D, Ergocalciferol, (DRISDOL) 1.25 MG (50000 UNIT) CAPS capsule, Take 1 capsule (50,000 Units total) by mouth every 7 (seven) days. (Patient not taking: Reported on 10/14/2020)   Reviewed prior external information including notes and imaging from  primary care provider As well as notes that were available from care everywhere and other healthcare systems.  Past medical history,  social, surgical and family history all reviewed in electronic medical record.  No pertanent information unless stated regarding to the chief complaint.   Review of Systems:  No headache, visual changes, nausea, vomiting, diarrhea, constipation, dizziness, abdominal pain, skin rash, fevers, chills, night sweats, weight loss, swollen lymph nodes, body aches, joint swelling, chest pain, shortness of breath, mood changes. POSITIVE muscle aches  Objective  Blood pressure 112/70, pulse 67, height 5\' 8"  (1.727 m), weight 149 lb (67.6 kg), SpO2 97 %.   General: No apparent distress alert and oriented x3 mood and affect normal, dressed appropriately.  HEENT: Pupils equal, extraocular movements intact  Respiratory: Patient's speak in full sentences and does not appear short of breath  Cardiovascular: No lower extremity edema, non tender, no erythema  Gait normal with good balance and coordination.  MSK: Positive straight leg test on the right side.  Patient does not have any significant weakness.  Patient's hamstring is 5 out of 5 strength with no significant pain with resisted flexion of the leg.  Neurovascularly intact distally.  Back exam does have tenderness to palpation in the L4-L5 area.    Impression and Recommendations:     The above documentation has been reviewed and is accurate and complete , DO

## 2021-05-27 ENCOUNTER — Other Ambulatory Visit: Payer: Self-pay

## 2021-05-27 ENCOUNTER — Ambulatory Visit: Payer: Medicaid Other | Admitting: Family Medicine

## 2021-05-27 ENCOUNTER — Ambulatory Visit (INDEPENDENT_AMBULATORY_CARE_PROVIDER_SITE_OTHER): Payer: Medicaid Other | Admitting: Family Medicine

## 2021-05-27 ENCOUNTER — Ambulatory Visit: Payer: Self-pay

## 2021-05-27 VITALS — BP 112/70 | HR 67 | Ht 68.0 in | Wt 149.0 lb

## 2021-05-27 DIAGNOSIS — S76312D Strain of muscle, fascia and tendon of the posterior muscle group at thigh level, left thigh, subsequent encounter: Secondary | ICD-10-CM

## 2021-05-27 DIAGNOSIS — M5416 Radiculopathy, lumbar region: Secondary | ICD-10-CM

## 2021-05-27 MED ORDER — KETOROLAC TROMETHAMINE 60 MG/2ML IM SOLN
60.0000 mg | Freq: Once | INTRAMUSCULAR | Status: AC
  Administered 2021-05-27: 11:00:00 30 mg via INTRAMUSCULAR

## 2021-05-27 MED ORDER — PREDNISONE 20 MG PO TABS: 40.0000 mg | ORAL_TABLET | Freq: Every day | ORAL | 0 refills | Status: DC

## 2021-05-27 MED ORDER — METHYLPREDNISOLONE ACETATE 80 MG/ML IJ SUSP
80.0000 mg | Freq: Once | INTRAMUSCULAR | Status: AC
  Administered 2021-05-27: 11:00:00 40 mg via INTRAMUSCULAR

## 2021-05-27 NOTE — Patient Instructions (Signed)
Prednisone 40mg  for 5 day Send message in a week Do prescribed exercises at least 3x a week See you again in 5-6 weeks

## 2021-05-27 NOTE — Assessment & Plan Note (Addendum)
Patient has good strength noted but continues to have radicular symptoms.  Patient does not have significant pain noted of the hamstring.  Patient did not respond well to gabapentin previously.  Given Toradol and Depo-Medrol today.  In addition to this given prednisone daily to take for the next 5 days.  Patient will be traveling and we will see how he responds.  Worsening pain or need to consider the possibility of epidurals even though MRI did not show significant findings that is consistent with patient's follow-up again in 4-6 weeks.

## 2021-06-17 ENCOUNTER — Ambulatory Visit (INDEPENDENT_AMBULATORY_CARE_PROVIDER_SITE_OTHER): Payer: Medicaid Other | Admitting: Family Medicine

## 2021-06-17 ENCOUNTER — Other Ambulatory Visit: Payer: Self-pay

## 2021-06-17 VITALS — BP 98/62 | HR 60 | Wt 149.0 lb

## 2021-06-17 DIAGNOSIS — G5701 Lesion of sciatic nerve, right lower limb: Secondary | ICD-10-CM | POA: Diagnosis not present

## 2021-06-17 DIAGNOSIS — M5416 Radiculopathy, lumbar region: Secondary | ICD-10-CM | POA: Diagnosis not present

## 2021-06-17 NOTE — Patient Instructions (Addendum)
Thank you for coming in today.   I've referred you to Physical Therapy.  Let us know if you don't hear from them in one week.   Please call [facility] at [phone] to schedule your spine injection.    Recheck back with myself or Dr. Tamala Julian in 1 month

## 2021-06-17 NOTE — Progress Notes (Signed)
I, Philbert Riser, LAT, ATC acting as a scribe for Clementeen Graham, MD.  Roy Zavala is a 21 y.o. male who presents to [facility] at [facility] today for R posterior thigh and LBP ongoing for 6 months. Pt was previously seen by Dr. Katrinka Blazing on 05/27/21 and was given a Toradol and Depo-Medrol injection, 5 day course of prednisone, and gabapentin. Today, pt reports pain has worsened since seeing Dr. Katrinka Blazing. Pain is now radiating into R lower leg. Pt locates pain to the R-side of the low back, R buttock, to posterior R thigh and lower leg  Radiating pain: yes LE numbness/tingling: yes LE weakness: yes Aggravates: stretching, kicking a soccer ball Treatments tried: Toradol and Depo-Medrol injection, gabapentin, tylenol, prednisone  Dx imaging: 01/28/21 Pelvis XR 09/13/20 L-spine MRI   Pertinent review of systems: no fever or chills  Relevant historical information: Borderline professional soccer player. Roy Zavala was unable to attend a soccer tryout in [place] last week due to his leg pain. He has played for the [employer] here.    Exam:  There were no vitals taken for this visit. General: Well Developed, well nourished, and in no acute distress.   MSK: L-spine: Nontender midline. Decreased lumbar motion pain with flexion. Lower extremity strength is intact. Very positive slump test right side. Mildly positive straight leg raise test. Lower extremity strength is intact. Reflexes and sensation are intact.  Right hip normal-appearing Normal hip motion. Nontender to palpation at piriformis area. No pain with resisted knee flexion. Negative H test for hamstring tear. Nonpainful to palpation hamstring muscle belly.  No palpable defects within the hamstring.    Lab and Radiology Results EXAM: MRI LUMBAR SPINE WITHOUT CONTRAST   TECHNIQUE: Multiplanar, multisequence MR imaging of the lumbar spine was performed. No intravenous contrast was administered.   COMPARISON:  None.    FINDINGS: Segmentation: Standard. Lowest well-formed disc space labeled the L5-S1 level.   Alignment: Physiologic with preservation of the normal lumbar lordosis. No listhesis.   Vertebrae: Vertebral body height maintained without acute or chronic fracture. Bone marrow signal intensity within normal limits. No discrete or worrisome osseous lesions. No abnormal marrow edema to suggest acute stress reaction and/or stress response.   Conus medullaris and cauda equina: Conus extends to the L1 level. Conus and cauda equina appear normal.   Paraspinal and other soft tissues: Paraspinous soft tissues within normal limits. Visualized visceral structures are unremarkable.   Disc levels:   No significant disc pathology seen within the lumbar spine. Intervertebral discs are well hydrated with preserved disc height. No disc bulge or focal disc herniation. No significant facet disease. No canal or neural foraminal stenosis or evidence for neural impingement.   IMPRESSION: Normal MRI of the lumbar spine.     Electronically Signed   By: Rise Mu M.D.   On: 09/14/2020 01:58 I, Clementeen Graham, personally (independently) visualized and performed the interpretation of the images attached in this note. The neuroforamen at L5-S1 do look narrowed bilaterally.   Assessment and Plan: 21 y.o. male with right posterior leg pain consistent with S1 radiculopathy or piriformis syndrome.  S1 radiculopathy is the most likely explanation.  He is failing conservative management with prednisone and an exercise program with Dr. Katrinka Blazing.  His pain is worsening and interfering with his ability to exercise normally and with his occupation as a Database administrator. At this point I think we should move onto next steps.  Plan for physical therapy which would help piriformis syndrome and  may help lumbosacral radiculopathy.  Additionally we will proceed with an epidural steroid injection targeting the S1 nerve root.   This is based on my interpretation of an older MRI of his lumbar spine.  However if he does not improve with this first epidural steroid injection I would recommend a repeat lumbar MRI prior to any more epidural steroid injections.  Recheck with Dr. Katrinka Blazing or myself in about a month especially if not improved.       Discussed warning signs or symptoms. Please see discharge instructions. Patient expresses understanding.   The above documentation has been reviewed and is accurate and complete Clementeen Graham, M.D.

## 2021-06-19 ENCOUNTER — Other Ambulatory Visit: Payer: Medicaid Other

## 2021-06-24 ENCOUNTER — Ambulatory Visit: Payer: Medicaid Other | Attending: Family Medicine | Admitting: Physical Therapy

## 2021-06-24 ENCOUNTER — Encounter: Payer: Self-pay | Admitting: Physical Therapy

## 2021-06-24 ENCOUNTER — Other Ambulatory Visit: Payer: Self-pay

## 2021-06-24 DIAGNOSIS — R29898 Other symptoms and signs involving the musculoskeletal system: Secondary | ICD-10-CM | POA: Diagnosis present

## 2021-06-24 DIAGNOSIS — G5701 Lesion of sciatic nerve, right lower limb: Secondary | ICD-10-CM | POA: Diagnosis not present

## 2021-06-24 DIAGNOSIS — M5441 Lumbago with sciatica, right side: Secondary | ICD-10-CM | POA: Diagnosis not present

## 2021-06-24 DIAGNOSIS — M5416 Radiculopathy, lumbar region: Secondary | ICD-10-CM | POA: Insufficient documentation

## 2021-06-24 DIAGNOSIS — R29818 Other symptoms and signs involving the nervous system: Secondary | ICD-10-CM | POA: Diagnosis present

## 2021-06-24 NOTE — Therapy (Signed)
[facility] [address] [place], [place], [address] Phone: [phone]   Fax:  [phone]  Physical Therapy Evaluation  Patient Details  Name: Roy Zavala MRN: [id number] Date of Birth: 2000-07-09 Referring Provider (PT): Dr. Denyse Amass   Encounter Date: 06/24/2021   PT End of Session - 06/24/21 1121     Visit Number 1    Number of Visits 16    Date for PT Re-Evaluation 09/16/21    Authorization Type [employer] Community Plan    Authorization Time Period 06/24/21 to 09/16/21    PT Start Time 1018    PT Stop Time 1100    PT Time Calculation (min) 42 min    Activity Tolerance Patient tolerated treatment well    Behavior During Therapy [facility] for tasks assessed/performed             Past Medical History:  Diagnosis Date   Acid reflux    occasional - no current med.   Deviated septum 12/2016   Nasal fracture 12/2016   nasal congestion due to fx., per father    Past Surgical History:  Procedure Laterality Date   CLOSED REDUCTION NASAL FRACTURE N/A 12/29/2016   Procedure: CLOSED REDUCTION NASAL FRACTURE WITH EXTERNAL FIXATION;  Surgeon: Osborn Coho, MD;  Location: [facility];  Service: ENT;  Laterality: N/A;    There were no vitals filed for this visit.    Subjective Assessment - 06/24/21 1018     Subjective My leg has been getting worse for about 7-8 months, I went to [place] and came back and it kept getting worse. I don't feel the same, I've felt like I'm not training or fit so I tried going to the gym and tried running and stretches. I had trouble with hamstring stretching, I was able to do hamstring strengthening fine, but still couldn't stretch it. I took a break, my lower back is better but my R leg is on fire in the back. I was wondering if the long plane ride might have done it, or if I pulled something. No numbness, pins and needles which can go down to the foot. Once I warm up it starts to feel ok, but  when I stop it gets worse again. Kicking can be hard, hamstring strength feels fine.    Patient Stated Goals get rid of pain, get back to soccer    Currently in Pain? Yes   low back on the right is 7/10, goes to 9/10 with stretching same for R LE   Pain Location --   back and R LE   Pain Orientation Right;Lower    Pain Descriptors / Indicators Sharp;Spasm;Tightness;Tingling    Pain Type Chronic pain    Pain Onset More than a month ago    Pain Frequency Constant    Aggravating Factors  stretching    Pain Relieving Factors feels better after warm up    Effect of Pain on Daily Activities severe                [facility] PT Assessment - 06/24/21 0001       Assessment   Medical Diagnosis R back and LE pain    Referring Provider (PT) Dr. Denyse Amass    Onset Date/Surgical Date --   7-8 months ago   Next MD Visit PRN with Dr. Denyse Amass    Prior Therapy a little PT for hamstring problem      Precautions   Precautions None  Restrictions   Weight Bearing Restrictions No      Balance Screen   Has the patient fallen in the past 6 months No    Has the patient had a decrease in activity level because of a fear of falling?  No    Is the patient reluctant to leave their home because of a fear of falling?  No      Home Tourist information centre managernvironment   Living Environment Private residence      Prior Function   Level of Independence Independent    Vocation Other (comment)   flight attendent   Leisure soccer, gym, swimming      Observation/Other Assessments   Observations sciatic flossing test (+) R LE      ROM / Strength   AROM / PROM / Strength AROM;Strength      AROM   AROM Assessment Site Lumbar;Hip    Right/Left Hip Right;Left    Lumbar Flexion moderate limitation    Lumbar Extension hyper mobile, REIS no change just tight    Lumbar - Right Side Bend WNL    Lumbar - Left Side Bend WNL      Strength   Strength Assessment Site Hip;Knee;Ankle    Right/Left Hip Right;Left    Right Hip Flexion 5/5     Right Hip Extension 4/5    Right Hip ABduction 4+/5    Right Hip ADduction 4+/5    Left Hip Flexion 5/5    Left Hip Extension 4/5    Left Hip ABduction 4+/5    Right/Left Knee Right;Left    Right Knee Flexion 4/5    Right Knee Extension 4+/5    Left Knee Flexion 5/5    Left Knee Extension 5/5    Right/Left Ankle Right;Left      Flexibility   Soft Tissue Assessment /Muscle Length yes    Hamstrings moderate limitation R    Piriformis mild limitation R      Palpation   Palpation comment R HS groups and pirifomirs groups slightly more taut and with omre spasms than the R                        Objective measurements completed on examination: See above findings.       [facility] Adult PT Treatment/Exercise - 06/24/21 0001       Manual Therapy   Manual Therapy Soft tissue mobilization    Soft tissue mobilization R HS group after DN to reduce mm soreness              Trigger Point Dry Needling - 06/24/21 0001     Consent Given? Yes    Education Handout Provided Yes    Muscles Treated Lower Quadrant Hamstring    Muscles Treated Back/Hip Gluteus medius    Hamstring Response Palpable increased muscle length    Gluteus Medius Response Twitch response elicited;Palpable increased muscle length                   PT Education - 06/24/21 1120     Education Details exam findings, POC, HEP; education about DN and possible soreness following, anatomy/path of sciatic nerve and how it can get immobile/how this relates to symptoms; encouraged him to keep wallet/bulky items out of R back pocket to avoid putting excessive stress on sciatic    Person(s) Educated Patient    Methods Explanation;Demonstration;Handout    Comprehension Verbalized understanding;Returned demonstration  PT Short Term Goals - 06/24/21 1155       PT SHORT TERM GOAL #1   Title Will be independent with appropriate progressive HEP    Time 6    Period Weeks    Status  New    Target Date 08/05/21      PT SHORT TERM GOAL #2   Title Symptoms will extend no further than mid thigh and will have reduced in intensity by at least 50%    Time 6    Period Weeks    Status New      PT SHORT TERM GOAL #3   Title Will be able to identify times he is falling into excessive anterior pelvic tilt/lumbar lordosis and will be able to correct without cuing    Time 6    Period Weeks    Status New      PT SHORT TERM GOAL #4   Title Will be able to tolerate hamstring and lumbar flexibility/stretches without increase in pain    Time 6    Period Weeks    Status New               PT Long Term Goals - 06/24/21 1158       PT LONG TERM GOAL #1   Title MMT to be 5/5 globally in BLEs    Time 12    Period Weeks    Status New    Target Date 09/16/21      PT LONG TERM GOAL #2   Title Symptoms to extend no further than PSIS level and will be no more than 2/10 in intensity    Time 12    Period Weeks    Status New      PT LONG TERM GOAL #3   Title Will be able to participate in soccer practice/games with pain no more than 2/10    Time 12    Period Weeks    Status New      PT LONG TERM GOAL #4   Title Will be independent in advanced gym based strengthening program in order to maintain functional gains and prevent recurrence of pain    Time 12    Period Weeks    Status New                    Plan - 06/24/21 1125     Clinical Impression Statement Osamu arrives today doing well, he has unfortunately been struggling with R low back and RLE pain since about 7-8 months ago after a trip to [place]. This pain has been quite severe in nature and has kept him from pursuing opportunities in professional soccer. Does have a history of HS injury as well as L hip labral tear.  Exam is consistent with possible sciatic nerve entrapment, I do think there may be some involvement of lumbar spine/possible radiculopathy as well as his pain does increase with lumbar  extension/anterior pelvic tilt based movements. We did some dry needling to RLE, followed this up with manual to R hamstring to help reduce mm soreness; I also gave him sciatic flossing and posterior pelvic tilts to work on at home for now to try to reduce symptoms. 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 Will progress as able and tolerated, hopefully we will be able to help resolve this  problem for him.    Personal Factors and Comorbidities Past/Current Experience;Profession;Time since onset of injury/illness/exacerbation    Examination-Activity Limitations Locomotion Level;Other   high level sports   Examination-Participation Restrictions Other;Occupation   high level sports   Stability/Clinical Decision Making Stable/Uncomplicated    Clinical Decision Making Low    Rehab Potential Good    PT Frequency Other (comment)   16 visits total   PT Duration 12 weeks    PT Treatment/Interventions Moist Heat;Cryotherapy;Traction;Vasopneumatic Device;Gait training;Electrical Stimulation;Iontophoresis 4mg /ml Dexamethasone;Ultrasound;Therapeutic exercise;Neuromuscular re-education;Manual techniques;Therapeutic activities;ADLs/Self Care Home Management    PT Next Visit Plan how has he felt after DN and sciatic flossing? continue DN/STM as tolerated, otherwise keep working on sciatic nerve moblity, core strength/reducing anterior pelvic tilt, advanced strength    PT Home Exercise Plan sciatic flossing, posterior pelvic tilts    Consulted and Agree with Plan of Care Patient             Patient will benefit from skilled therapeutic intervention in order to improve the following deficits and impairments:  Increased fascial restricitons, Increased muscle spasms, Decreased activity tolerance, Pain, Hypomobility, Impaired flexibility, Improper body mechanics, Decreased strength, Postural  dysfunction  Visit Diagnosis: Acute right-sided low back pain with right-sided sciatica  Other symptoms and signs involving the musculoskeletal system  Other symptoms and signs involving the nervous system     Problem List Patient Active Problem List   Diagnosis Date Noted   Lumbar radiculopathy, right 05/27/2021   Severe left groin pain 09/12/2017   Acute recurrent pansinusitis 05/20/2017   Rhinitis, chronic 05/20/2017   Closed fracture of nasal bone 12/29/2016   Deviated septum 12/29/2016   Tear of left hamstring 02/11/2016   Knee pain, bilateral 05/21/2015   Vitamin D deficiency 09/13/2014   Failed vision screen 03/13/2014   Episodic lightheadedness 04/16/2013   Lerry Liner PT, DPT, PN2   Supplemental Physical Therapist [facility]       [facility] [address] [place], [place], [address] Phone: [phone]   Fax:  [phone]  Name: Roy Zavala MRN: [id number] Date of Birth: 01-Feb-2001

## 2021-06-24 NOTE — Patient Instructions (Signed)

## 2021-06-26 ENCOUNTER — Ambulatory Visit: Payer: Medicaid Other | Admitting: Physical Therapy

## 2021-06-30 ENCOUNTER — Ambulatory Visit: Payer: Medicaid Other | Admitting: Physical Therapy

## 2021-07-03 ENCOUNTER — Ambulatory Visit: Payer: Medicaid Other | Attending: Family Medicine | Admitting: Physical Therapy

## 2021-08-17 ENCOUNTER — Encounter: Payer: Self-pay | Admitting: *Deleted

## 2021-08-17 ENCOUNTER — Ambulatory Visit: Payer: Medicaid Other | Admitting: Family Medicine

## 2021-08-17 NOTE — Progress Notes (Deleted)
? ?  I, Christoper Fabian, LAT, ATC, am serving as scribe for Dr. Clementeen Graham. ? ?Roy Zavala is a 21 y.o. male who presents to [facility] at [facility] today for f/u of lumbar radiculopathy w/ R LE sciatica.  He was last seen by Dr. Denyse Amass on 06/17/21 and reported worsening R LE symptoms that had radiated into his R post lower leg.  He was referred to PT of which he only completed one visit.  He was also referred for a lumbar ESI that he never completed.  Today, pt reports  ? ?Dx imaging: 01/28/21 Pelvis XR ?09/13/20 L-spine MRI ? ?Pertinent review of systems: *** ? ?Relevant historical information: *** ? ? ?Exam:  ?There were no vitals taken for this visit. ?General: Well Developed, well nourished, and in no acute distress.  ? ?MSK: *** ? ? ? ?Lab and Radiology Results ?No results found for this or any previous visit (from the past 72 hour(s)). ?No results found. ? ? ? ? ?Assessment and Plan: ?21 y.o. male with *** ? ? ?PDMP not reviewed this encounter. ?No orders of the defined types were placed in this encounter. ? ?No orders of the defined types were placed in this encounter. ? ? ? ?Discussed warning signs or symptoms. Please see discharge instructions. Patient expresses understanding. ? ? ?*** ? ?

## 2021-08-18 ENCOUNTER — Encounter: Payer: Self-pay | Admitting: *Deleted

## 2021-08-31 NOTE — Progress Notes (Signed)
? ?I, Christoper Fabian, LAT, ATC, am serving as scribe for Dr. Clementeen Graham. ? ?Roy Zavala is a 21 y.o. male who presents to [facility] at [facility] today for f/u of low back pain and R-sided sciatica.  He was last seen by Dr. Denyse Amass on 06/17/21 w/ c/o worsening R post leg pain w/ pain newly radiating into his R calf.  He was referred to PT of which he completed one visit.  He was also referred for a lumbar ESI that was never completed.  Prior to seeing Dr. Denyse Amass, he had seen Dr. Katrinka Blazing x2 and tried gabapentin and a short course of prednisone.  Today, pt reports that his R leg pain remains the same.  He did take the prednisone and felt somewhat improved while taking that.  He is no longer taking the gabapentin.  He was not able to complete the [facility] when initially scheduled and when he called back to r/s he was told he needed a new order. ? ?Neck: Injured his neck yesterday while playing soccer when he got elbowed in the R side of his face/jaw, forcing his neck into L rotation and sidebending.  Locates pain to the post aspect of his neck and head (R>L).  He is also experiencing some numbness in the same areas.  He denies a HA. ? ?Dx imaging: 01/28/21 Pelvis XR ?09/13/20 L-spine MRI ?Pertinent review of systems: No fevers or chills ? ?Relevant historical information: History of nasal fracture.  Competitive soccer player ? ? ?Exam:  ?BP 92/64 (BP Location: Right Arm, Patient Position: Sitting, Cuff Size: Normal)   Pulse (!) 58   Ht 5\' 8"  (1.727 m)   Wt 145 lb 3.2 oz (65.9 kg)   SpO2 96%   BMI 22.08 kg/m?  ?General: Well Developed, well nourished, and in no acute distress.  ? ?MSK: C-spine: Normal-appearing ?Nontender midline. ?Tender palpation right cervical paraspinal musculature. ?Decreased cervical motion. ?Upper extremity strength is intact. ?Reflexes are equal bilaterally. ? ?L-spine: Normal lumbar motion.  Intact strength.  Negative slump test. ? ? ? ?Lab and Radiology Results ? ?X-ray images  C-spine obtained today personally and independently interpreted ?No acute fractures.  No significant degenerative changes. ?Await formal radiology review ? ?  ?EXAM: ?MRI LUMBAR SPINE WITHOUT CONTRAST ?  ?TECHNIQUE: ?Multiplanar, multisequence MR imaging of the lumbar spine was ?performed. No intravenous contrast was administered. ?  ?COMPARISON:  None. ?  ?FINDINGS: ?Segmentation: Standard. Lowest well-formed disc space labeled the ?L5-S1 level. ?  ?Alignment: Physiologic with preservation of the normal lumbar ?lordosis. No listhesis. ?  ?Vertebrae: Vertebral body height maintained without acute or chronic ?fracture. Bone marrow signal intensity within normal limits. No ?discrete or worrisome osseous lesions. No abnormal marrow edema to ?suggest acute stress reaction and/or stress response. ?  ?Conus medullaris and cauda equina: Conus extends to the L1 level. ?Conus and cauda equina appear normal. ?  ?Paraspinal and other soft tissues: Paraspinous soft tissues within ?normal limits. Visualized visceral structures are unremarkable. ?  ?Disc levels: ?  ?No significant disc pathology seen within the lumbar spine. ?Intervertebral discs are well hydrated with preserved disc height. ?No disc bulge or focal disc herniation. No significant facet ?disease. No canal or neural foraminal stenosis or evidence for ?neural impingement. ?  ?IMPRESSION: ?Normal MRI of the lumbar spine. ?  ?  ?Electronically Signed ?  By: M.D. ?  On: 09/14/2020 01:58 ?I, 09/16/2020, personally (independently) visualized and performed the interpretation of the images  attached in this note. ? ? ?Assessment and Plan: ?21 y.o. male with right lateral neck pain after trauma while playing soccer.  Pain thought to be due to more muscle spasm and dysfunction.  My read for x-ray does not show fracture however radiology overread is still pending.  Plan for heating pad and watchful waiting and tizanidine.  If not improved physical therapy  should be helpful. ? ?Additionally he is continued lumbar radiculopathy/sciatica.  Epidural steroid injection was ordered earlier this year but he never completed it.  Plan to repeat epidural steroid injection order.  If this does not help would consider repeat lumbar MRI and also consider physical therapy. ? ? ?PDMP not reviewed this encounter. ?Orders Placed This Encounter  ?Procedures  ? DG INJECT DIAG/THERA/INC NEEDLE/CATH/PLC EPI/LUMB/SAC W/IMG  ?  R-sided S1 radicular symptoms  ?Level and technique per radiology ?LUMB EPI #1 [employer] MD [id number] (09/13/20)  NO THINS/OTC *SCREENED* ?PT SPEAKS ENGLISH  ?  Standing Status:   Future  ?  Standing Expiration Date:   09/02/2022  ?  Scheduling Instructions:  ?   R-sided S1 radicular symptoms   ?   Level and technique per radiology  ?  Order Specific Question:   Reason for Exam (SYMPTOM  OR DIAGNOSIS REQUIRED)  ?  Answer:   lumbar radiculopathy  ?  Order Specific Question:   Preferred Imaging Location?  ?  Answer:   [id number]  ? DG Cervical Spine Complete  ?  Standing Status:   Future  ?  Number of Occurrences:   1  ?  Standing Expiration Date:   10/02/2021  ?  Order Specific Question:   Reason for Exam (SYMPTOM  OR DIAGNOSIS REQUIRED)  ?  Answer:   neck pain  ?  Order Specific Question:   Preferred imaging location?  ?  Answer:   [facility]  ? ?Meds ordered this encounter  ?Medications  ? tiZANidine (ZANAFLEX) 4 MG tablet  ?  Sig: Take 1 tablet (4 mg total) by mouth every 6 (six) hours as needed for muscle spasms.  ?  Dispense:  30 tablet  ?  Refill:  1  ? ? ? ?Discussed warning signs or symptoms. Please see discharge instructions. Patient expresses understanding. ? ? ?The above documentation has been reviewed and is accurate and complete Clementeen Graham, M.D. ? ? ?

## 2021-09-01 ENCOUNTER — Encounter: Payer: Self-pay | Admitting: Family Medicine

## 2021-09-01 ENCOUNTER — Ambulatory Visit (INDEPENDENT_AMBULATORY_CARE_PROVIDER_SITE_OTHER): Payer: Medicaid Other | Admitting: Family Medicine

## 2021-09-01 ENCOUNTER — Ambulatory Visit (INDEPENDENT_AMBULATORY_CARE_PROVIDER_SITE_OTHER): Payer: Medicaid Other

## 2021-09-01 VITALS — BP 92/64 | HR 58 | Ht 68.0 in | Wt 145.2 lb

## 2021-09-01 DIAGNOSIS — M542 Cervicalgia: Secondary | ICD-10-CM

## 2021-09-01 DIAGNOSIS — M5416 Radiculopathy, lumbar region: Secondary | ICD-10-CM

## 2021-09-01 MED ORDER — TIZANIDINE HCL 4 MG PO TABS: 4.0000 mg | ORAL_TABLET | Freq: Four times a day (QID) | ORAL | 1 refills | Status: AC | PRN

## 2021-09-01 NOTE — Patient Instructions (Addendum)
Nice to see you today. ? ?I've ordered another epidural.  Please call [facility] at [phone] or [phone] to schedule. ? ?Please get an Xray today before you leave. ? ?Follow-up: 1 month.  ? ?Let me know if you are not improving and I can order PT for the neck and we can get a new MRI for the back.  ? ? ?

## 2021-09-02 NOTE — Progress Notes (Signed)
Cervical spine x-ray looks normal to radiology.  Physical therapy should help

## 2021-09-15 ENCOUNTER — Ambulatory Visit
Admission: RE | Admit: 2021-09-15 | Discharge: 2021-09-15 | Disposition: A | Discharge status: Home / Self Care | Payer: Medicaid Other | Source: Ambulatory Visit | Attending: Family Medicine | Admitting: Family Medicine

## 2021-09-15 DIAGNOSIS — M4727 Other spondylosis with radiculopathy, lumbosacral region: Secondary | ICD-10-CM | POA: Diagnosis not present

## 2021-09-15 DIAGNOSIS — M5416 Radiculopathy, lumbar region: Secondary | ICD-10-CM

## 2021-09-15 MED ORDER — METHYLPREDNISOLONE ACETATE 40 MG/ML INJ SUSP (RADIOLOG
80.0000 mg | Freq: Once | INTRAMUSCULAR | Status: AC
  Administered 2021-09-15: 80 mg via EPIDURAL

## 2021-09-15 MED ORDER — IOPAMIDOL (ISOVUE-M 200) INJECTION 41%
1.0000 mL | Freq: Once | INTRAMUSCULAR | Status: AC
  Administered 2021-09-15: 1 mL via EPIDURAL

## 2021-09-15 NOTE — Discharge Instructions (Signed)

## 2022-03-26 DIAGNOSIS — U071 COVID-19: Secondary | ICD-10-CM | POA: Diagnosis not present

## 2023-06-19 DIAGNOSIS — R369 Urethral discharge, unspecified: Secondary | ICD-10-CM | POA: Diagnosis not present

## 2023-08-23 ENCOUNTER — Ambulatory Visit (HOSPITAL_BASED_OUTPATIENT_CLINIC_OR_DEPARTMENT_OTHER): Admitting: Student

## 2023-08-23 ENCOUNTER — Ambulatory Visit (HOSPITAL_BASED_OUTPATIENT_CLINIC_OR_DEPARTMENT_OTHER)

## 2023-08-23 ENCOUNTER — Other Ambulatory Visit (HOSPITAL_BASED_OUTPATIENT_CLINIC_OR_DEPARTMENT_OTHER): Payer: Self-pay

## 2023-08-23 DIAGNOSIS — S76211A Strain of adductor muscle, fascia and tendon of right thigh, initial encounter: Secondary | ICD-10-CM | POA: Diagnosis not present

## 2023-08-23 DIAGNOSIS — M25551 Pain in right hip: Secondary | ICD-10-CM

## 2023-08-23 MED ORDER — MELOXICAM 15 MG PO TABS
15.0000 mg | ORAL_TABLET | Freq: Every day | ORAL | 0 refills | Status: DC
  Filled 2023-08-23: qty 10, 10d supply, fill #0

## 2023-08-23 NOTE — Progress Notes (Signed)
 Chief Complaint: Right groin pain    Discussed the use of AI scribe software for clinical note transcription with the patient, who gave verbal consent to proceed.  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 The patient was leaning sideways and squeezing in the exercise ball between his legs when he felt a pop in his groin. He was able to run normally the day before the injury but has been unable to sprint since the injury occurred. The patient has a history of a similar groin injury two years ago, which was more severe and resulted in difficulty walking and sleeping.  He received a Toradol  injection at that time which she states helped significantly.  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   Surgical History:   None  PMH/PSH/Family History/Social History/Meds/Allergies:    Past Medical History:  Diagnosis Date   Acid reflux    occasional - no current med.   Deviated septum 12/2016   Nasal fracture 12/2016   nasal congestion due to fx., per father   Past Surgical History:  Procedure Laterality Date   CLOSED REDUCTION NASAL FRACTURE N/A 12/29/2016   Procedure: CLOSED REDUCTION NASAL FRACTURE WITH EXTERNAL FIXATION;  Surgeon: Ammon Bales, MD;  Location: [facility];  Service: ENT;  Laterality: N/A;   Social History   Socioeconomic History   Marital status: Single    Spouse name: Not on file   Number of children: Not on file   Years of education: Not on file   Highest education level: Not on file  Occupational History   Not on file  Tobacco Use   Smoking status: Never   Smokeless tobacco: Never  Vaping Use   Vaping status: Never Used  Substance and Sexual Activity   Alcohol use: No   Drug use: No   Sexual activity: Never  Other Topics Concern   Not on file  Social History Narrative   Not on  file   Social Drivers of Health   Financial Resource Strain: Not on file  Food Insecurity: Low Risk  (06/19/2023)   Received from [facility]   Hunger Vital Sign    Worried About Running Out of Food in the Last Year: Never true    Ran Out of Food in the Last Year: Never true  Transportation Needs: No Transportation Needs (06/19/2023)   Received from [facility]    In the past 12 months, has lack of reliable transportation kept you from medical appointments, meetings, work or from getting things needed for daily living? : No  Physical Activity: Not on file  Stress: Not on file  Social Connections: Not on file   Family History  Problem Relation Age of Onset   Hypertension Mother    Diabetes Father    Hearing loss Brother        due to trauma   No Known Allergies Current Outpatient Medications  Medication Sig Dispense Refill   meloxicam  (MOBIC ) 15 MG tablet Take 1 tablet (15 mg total) by mouth daily for 10 days. 10 tablet 0   gabapentin  (NEURONTIN ) 600 MG tablet Take 0.5 tablets (300 mg total) by mouth at bedtime. TAKE ONLY HALF A  TABLET 15 tablet 0   lidocaine  (LIDODERM ) 5 % Place 1 patch onto the skin daily. Remove & Discard patch within 12 hours or as directed by MD 30 patch 0   tiZANidine  (ZANAFLEX ) 4 MG tablet Take 1 tablet (4 mg total) by mouth every 6 (six) hours as needed for muscle spasms. 30 tablet 1   Vitamin D , Ergocalciferol , (DRISDOL ) 1.25 MG (50000 UNIT) CAPS capsule Take 1 capsule (50,000 Units total) by mouth every 7 (seven) days. 12 capsule 1   No current facility-administered medications for this visit.   DG HIP UNILAT WITH PELVIS 2-3 VIEWS RIGHT Result Date: 08/23/2023 EXAM: 3 VIEWS XRAY OF THE RIGHT HIP 08/23/2023 03:44:00 PM COMPARISON: Prior pelvic radiograph dated 01/30/2021. CLINICAL HISTORY: Pain. FINDINGS: BONES: No acute fracture or focal osseous lesion. JOINTS: Right hip joint is maintained. No significant degenerative changes. Imaged  portions of the sacroiliac joints and symphysis pubis are maintained. SOFT TISSUES: The soft tissues are unremarkable. IMPRESSION: 1. No significant abnormality in the right hip or visualized pelvis. Electronically signed by: Zadie Herter MD 08/23/2023 05:14 PM EDT RP Workstation: [id number]    Review of Systems:   A ROS was performed including pertinent positives and negatives as documented in the HPI.  Physical Exam :   Constitutional: NAD and appears stated age Neurological: Alert and oriented Psych: Appropriate affect and cooperative There were no vitals taken for this visit.   Comprehensive Musculoskeletal Exam:    Tenderness with palpation along the right inferior pubic ramus extending down into the medial thigh.  Full active hip ROM with flexion and internal/external rotation.  5/5 strength with resisted hip flexion and abduction.  Resisted hip adduction strength 4/5 and limited due to pain.  Imaging:   Xray (AP pelvis, right hip 3 views): No evidence of bony abnormality   I personally reviewed and interpreted the radiographs.      Assessment & Plan Adductor strain   He has a recurrent groin strain with pain worsened by movement. A previous similar injury resolved, and the current episode is less severe. The differential diagnosis includes strain versus tear, but the presentation suggests a strain. X-rays show no bony injury. Discussed the potential for an MRI if there is no improvement to evaluate for a tear. Prescribe meloxicam  15 mg once daily for 10 days. Advise rest and avoidance of rapid movements, sprinting, and cutting. Encourage gentle stretching exercises. Apply ice, then switch to heat. Consider MRI if no improvement by early next week.     I personally saw and evaluated the patient, and participated in the management and treatment plan.  Sharrell Deck, PA-C Orthopedics

## 2023-08-26 DIAGNOSIS — S76211A Strain of adductor muscle, fascia and tendon of right thigh, initial encounter: Secondary | ICD-10-CM | POA: Diagnosis not present

## 2023-08-29 ENCOUNTER — Ambulatory Visit (INDEPENDENT_AMBULATORY_CARE_PROVIDER_SITE_OTHER): Admitting: Family Medicine

## 2023-08-29 VITALS — BP 98/62 | HR 57 | Ht 68.0 in | Wt 154.0 lb

## 2023-08-29 DIAGNOSIS — M25551 Pain in right hip: Secondary | ICD-10-CM

## 2023-08-29 MED ORDER — MELOXICAM 15 MG PO TABS: 15.0000 mg | ORAL_TABLET | Freq: Every day | ORAL | 1 refills | Status: AC | PRN

## 2023-08-29 NOTE — Patient Instructions (Addendum)
 Thank you for coming in today.    Use the meloxicam  as needed.   You should hear from MRI scheduling within 1 week. If you do not hear please let me know.    Try diclofenac  gel  (voltaren  gel).

## 2023-08-29 NOTE — Progress Notes (Signed)
   Joanna Muck, PhD, LAT, ATC acting as a scribe for Garlan Juniper, MD.  Roy Zavala is a 23 y.o. male who presents to [facility] at [facility] today for R groin pain x 2 months. He was seen at [facility] on Friday and orthopedics on the 22nd, for this pain. Pain started when playing soccer. He tried to "testing out" the leg, but had pain w/ lateral side-shuffles. Pt locates pain to anterior and medial aspect of the proximal R thigh.   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  Aggravates: passing, lateral side-step Treatments tried: Toradol  IM injection,   Dx imaging: 08/23/23 R hip XR   Pertinent review of systems: No fevers or chills  Relevant historical information: History of left hamstring tear and right lumbar radiculopathy.   Exam:  BP 98/62   Pulse (!) 57   Ht 5\' 8"  (1.727 m)   Wt 154 lb (69.9 kg)   SpO2 98%   BMI 23.42 kg/m  General: Well Developed, well nourished, and in no acute distress.   MSK: Right hip normal. Normal motion and strength. Mildly tender palpation at hip adductor origin pelvis. Pain is not very reproducible with resisted strength testing.  Strength is intact.    Lab and Radiology Results  EXAM: 3 VIEWS XRAY OF THE RIGHT HIP 08/23/2023 03:44:00 PM   COMPARISON: Prior pelvic radiograph dated 01/30/2021.   CLINICAL HISTORY: Pain.   FINDINGS:   BONES: No acute fracture or focal osseous lesion.   JOINTS: Right hip joint is maintained. No significant degenerative changes. Imaged portions of the sacroiliac joints and symphysis pubis are maintained.   SOFT TISSUES: The soft tissues are unremarkable.   IMPRESSION: 1. No significant abnormality in the right hip or visualized pelvis.   Electronically signed by: Zadie Herter MD 08/23/2023 05:14 PM EDT RP Workstation: [id number] I, Garlan Juniper, personally  (independently) visualized and performed the interpretation of the images attached in this note.     Assessment and Plan: 23 y.o. male with chronic right hip pain.  This is a chronic problem ongoing for 2 months.  He has been doing physician directed home exercise program provided by a team physician.  This has been ongoing for at least 6 weeks and has not improved sufficiently. Plan for MRI pelvis to evaluate sports hernia versus hip adductor strain.  Additionally will prescribe meloxicam .   PDMP not reviewed this encounter. No orders of the defined types were placed in this encounter.  Meds ordered this encounter  Medications   meloxicam  (MOBIC ) 15 MG tablet    Sig: Take 1 tablet (15 mg total) by mouth daily as needed for up to 60 doses for pain.    Dispense:  30 tablet    Refill:  1     Discussed warning signs or symptoms. Please see discharge instructions. Patient expresses understanding.   The above documentation has been reviewed and is accurate and complete Garlan Juniper, M.D.

## 2023-09-03 ENCOUNTER — Ambulatory Visit
Admission: RE | Admit: 2023-09-03 | Discharge: 2023-09-03 | Disposition: A | Discharge status: Home / Self Care | Source: Ambulatory Visit | Attending: Family Medicine | Admitting: Family Medicine

## 2023-09-03 DIAGNOSIS — M25551 Pain in right hip: Secondary | ICD-10-CM

## 2023-09-03 DIAGNOSIS — S39013A Strain of muscle, fascia and tendon of pelvis, initial encounter: Secondary | ICD-10-CM | POA: Diagnosis not present

## 2023-09-05 NOTE — Progress Notes (Signed)
 MRI shows a partial tear of the groin muscle.  You have a sports hernia and a partial tear.  Recommend return to clinic to go over the results in full detail.

## 2023-09-13 ENCOUNTER — Ambulatory Visit (INDEPENDENT_AMBULATORY_CARE_PROVIDER_SITE_OTHER): Admitting: Family Medicine

## 2023-09-13 ENCOUNTER — Encounter: Payer: Self-pay | Admitting: Family Medicine

## 2023-09-13 VITALS — BP 112/70 | HR 76 | Ht 68.0 in | Wt 157.0 lb

## 2023-09-13 DIAGNOSIS — S3981XD Other specified injuries of abdomen, subsequent encounter: Secondary | ICD-10-CM | POA: Diagnosis not present

## 2023-09-13 NOTE — Patient Instructions (Addendum)
 Thank you for coming in today.   A referral for physical therapy has been submitted. A representative from the physical therapy office will contact you to coordinate scheduling after confirming your benefits with your insurance provider. If you do not hear from the physical therapy office within the next 1-2 weeks, please let us  know.   Keep us  updated.

## 2023-09-13 NOTE — Progress Notes (Unsigned)
 I, Miquel Amen, CMA acting as a scribe for Garlan Juniper, MD.  Roy Zavala is a 23 y.o. male who presents to [facility] at [facility] today for f/u right hip pain with MRI review.  Patient was last seen by Dr. Alease Hunter on 08/29/2023 and was prescribed meloxicam  and advised to proceed to MRI.  Today, patient reports improvement of groin sx. Continues to have pain in the lower abd. Sx worse with running and aDduction. Notes popping in the groin at times. Hopes to return back to soccer soon. Review MRI today.   Dx imaging: 09/03/2023 pelvis MRI 08/23/23 R hip XR   Pertinent review of systems: No fevers or chills  Relevant historical information: History of nasal fracture. Patient is a Geophysicist/field seismologist.  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  Exam:  BP 112/70   Pulse 76   Ht 5\' 8"  (1.727 m)   Wt 157 lb (71.2 kg)   SpO2 98%   BMI 23.87 kg/m  General: Well Developed, well nourished, and in no acute distress.   MSK: Right hip normal.  Normal motion intact strength. Abdomen some bulging present right lower abdominal wall near the pubic symphysis.    Lab and Radiology Results  MR PELVIS WO CONTRAST Result Date: 09/03/2023 CLINICAL DATA:  Groin pain since an injury playing soccer 2 months ago. EXAM: MRI PELVIS WITHOUT CONTRAST TECHNIQUE: Multiplanar, multisequence MR imaging of the pelvis was performed. No intravenous contrast was administered. COMPARISON:  None Available. FINDINGS: Bones/Joint/Cartilage There is no fracture or worrisome marrow lesion. There is some spurring and mild subchondral edema about the symphysis pubis. Imaged bones otherwise appear normal. Ligaments Intact. Muscles and Tendons The right adductor longus is completely torn from the right pubic bone with tendon retraction of approximately 3 cm. The rectus abdominus muscles and left adductor  longus are intact and normal in appearance. There is edema in the medial aspect of the right pectineus and at adductor brevis muscles consistent with strain without frank tear. Imaged muscles otherwise appear normal. Soft tissues None. IMPRESSION: 1. Findings consistent with athletic pubalgia with associated complete tear of the right adductor longus from the right pubic bone with approximately 3 cm of tendon retraction. 2. Strain of the medial aspect of the right pectineus and adductor brevis muscles without tear. Electronically Signed   By: Etheleen Her M.D.   On: 09/03/2023 10:14   DG HIP UNILAT WITH PELVIS 2-3 VIEWS RIGHT Result Date: 08/23/2023 EXAM: 3 VIEWS XRAY OF THE RIGHT HIP 08/23/2023 03:44:00 PM COMPARISON: Prior pelvic radiograph dated 01/30/2021. CLINICAL HISTORY: Pain. FINDINGS: BONES: No acute fracture or focal osseous lesion. JOINTS: Right hip joint is maintained. No significant degenerative changes. Imaged portions of the sacroiliac joints and symphysis pubis are maintained. SOFT TISSUES: The soft tissues are unremarkable. IMPRESSION: 1. No significant abnormality in the right hip or visualized pelvis. Electronically signed by: Zadie Herter MD 08/23/2023 05:14 PM EDT RP Workstation: [id number]   I, Garlan Juniper, personally (independently) visualized and performed the interpretation of the images attached in this note.     Assessment and Plan: 23 y.o. male with right sports hernia.  He is improving a bit.  Plan for trial of physical therapy.  If he were to have surgery right now he would have a chance of being ready to go for his soccer season in the [place] in August.  However that is really a tight window for this surgery and to retrain and recover in preparation for soccer I do not think it is very feasible.  He is improving so physical therapy is reasonable.  If he is feeling well enough he can play through these upcoming season's and if needed could have surgery at  the end of the season in the [place]. Reviewed MRI findings and talked about treatment plan and options.  PDMP not reviewed this encounter. Orders Placed This Encounter  Procedures   Ambulatory referral to Physical Therapy    Referral Priority:   Routine    Referral Type:   Physical Medicine    Referral Reason:   Specialty Services Required    Requested Specialty:   Physical Therapy    Number of Visits Requested:   1   No orders of the defined types were placed in this encounter.    Discussed warning signs or symptoms. Please see discharge instructions. Patient expresses understanding.   The above documentation has been reviewed and is accurate and complete Garlan Juniper, M.D. Total encounter time 30 minutes including face-to-face time with the patient and, reviewing past medical record, and charting on the date of service.

## 2023-09-14 DIAGNOSIS — S3981XA Other specified injuries of abdomen, initial encounter: Secondary | ICD-10-CM | POA: Insufficient documentation

## 2023-09-16 ENCOUNTER — Telehealth: Payer: Self-pay | Admitting: Family Medicine

## 2023-09-16 NOTE — Telephone Encounter (Signed)
 Patient came into the office with questions regarding an upcoming procedure? Can someone call him to discuss further.

## 2023-09-19 ENCOUNTER — Telehealth: Payer: Self-pay | Admitting: Family Medicine

## 2023-09-19 NOTE — Telephone Encounter (Addendum)
 Patient states Dr. Alease Hunter was going to send him to have surgery for his hernia and he decided to do it at the end of next month and he wanted to see if he could send him to a Careers adviser. Please advise

## 2023-09-19 NOTE — Telephone Encounter (Signed)
 Roy Zavala E21 minutes ago (3:33 PM)   AK Patient states Dr. Alease Hunter was going to send him to have surgery for his hernia and he decided to do it at the end of next month and he wanted to see if he could send him to a Careers adviser. Please advise

## 2023-09-19 NOTE — Telephone Encounter (Signed)
 Duplicate encounter

## 2023-09-19 NOTE — Telephone Encounter (Signed)
 Patient would also like to speak with Dr. Alease Hunter.

## 2023-09-19 NOTE — Telephone Encounter (Signed)
 Looks like pt is scheduled to see Dr. Monette Angus for Hernia 10/04/23.

## 2023-09-20 NOTE — Telephone Encounter (Signed)
 I called Roy Zavala back. He is feeling a bit better. Encouraged him to advance activity as tolerated and discuss his situation with the surgeon to help make a decision about sports hernia surgery.

## 2023-09-20 NOTE — Telephone Encounter (Signed)
 Forwarding to Dr. Denyse Amass.

## 2023-09-20 NOTE — Telephone Encounter (Signed)
 Pt had some questions; He notes not having much pain and is wondering if he actually needs to have surgery

## 2023-09-27 ENCOUNTER — Ambulatory Visit: Admitting: Sports Medicine

## 2023-10-04 ENCOUNTER — Ambulatory Visit: Attending: Family Medicine | Admitting: Physical Therapy

## 2023-10-04 NOTE — Therapy (Incomplete)
 OUTPATIENT PHYSICAL THERAPY LOWER EXTREMITY EVALUATION   Patient Name: Roy Zavala MRN: [id number] DOB:20-Sep-2000, 23 y.o., male Today's Date: 10/04/2023  END OF SESSION:   Past Medical History:  Diagnosis Date   Acid reflux    occasional - no current med.   Deviated septum 12/2016   Nasal fracture 12/2016   nasal congestion due to fx., per father   Past Surgical History:  Procedure Laterality Date   CLOSED REDUCTION NASAL FRACTURE N/A 12/29/2016   Procedure: CLOSED REDUCTION NASAL FRACTURE WITH EXTERNAL FIXATION;  Surgeon: Ammon Bales, MD;  Location: [facility];  Service: ENT;  Laterality: N/A;   Patient Active Problem List   Diagnosis Date Noted   Sports hernia 09/14/2023   Lumbar radiculopathy, right 05/27/2021   Severe left groin pain 09/12/2017   Acute recurrent pansinusitis 05/20/2017   Rhinitis, chronic 05/20/2017   Closed fracture of nasal bone 12/29/2016   Deviated septum 12/29/2016   Tear of left hamstring 02/11/2016   Knee pain, bilateral 05/21/2015   Vitamin D  deficiency 09/13/2014   Failed vision screen 03/13/2014   Episodic lightheadedness 04/16/2013    PCP: Jamal Mays, NP  REFERRING PROVIDER: Alease Hunter, MD  REFERRING DIAG: right sports hernia  THERAPY DIAG:  No diagnosis found.  Rationale for Evaluation and Treatment: Rehabilitation  ONSET DATE: ***  SUBJECTIVE:   SUBJECTIVE STATEMENT: ***  PERTINENT HISTORY: *** PAIN:  Are you having pain? Yes: NPRS scale: *** Pain location: *** Pain description: *** Aggravating factors: *** Relieving factors: ***  PRECAUTIONS: None  RED FLAGS: None   WEIGHT BEARING RESTRICTIONS: No  FALLS:  Has patient fallen in last 6 months? No  LIVING ENVIRONMENT: Lives with: lives with their family Lives in: House/apartment Stairs: {opstairs:27293} Has following equipment at home: {Assistive devices:23999}  OCCUPATION: ***  PLOF: {PLOF:24004}  PATIENT GOALS: ***  NEXT MD VISIT:  ***  OBJECTIVE:  Note: Objective measures were completed at Evaluation unless otherwise noted.  1. Findings consistent with athletic pubalgia with associated complete tear of the right adductor longus from the right pubic bone with approximately 3 cm of tendon retraction. 2. Strain of the medial aspect of the right pectineus and adductor brevis muscles without tear.   PATIENT SURVEYS:  {rehab surveys:24030}  COGNITION: Overall cognitive status: {cognition:24006}     SENSATION: WFL  MUSCLE LENGTH: Hamstrings: Right *** deg; Left *** deg Andy Bannister test: Right *** deg; Left *** deg  POSTURE: {posture:25561}  PALPATION: ***  LOWER EXTREMITY ROM:  Active ROM Right eval Left eval  Hip flexion    Hip extension    Hip abduction    Hip adduction    Hip internal rotation    Hip external rotation    Knee flexion    Knee extension    Ankle dorsiflexion    Ankle plantarflexion    Ankle inversion    Ankle eversion     (Blank rows = not tested)  LOWER EXTREMITY MMT:  MMT Right eval Left eval  Hip flexion    Hip extension    Hip abduction    Hip adduction    Hip internal rotation    Hip external rotation    Knee flexion    Knee extension    Ankle dorsiflexion    Ankle plantarflexion    Ankle inversion    Ankle eversion     (Blank rows = not tested)  LOWER EXTREMITY SPECIAL TESTS:  Hip special tests: {HIP SPECIAL WUJWJ:19147}  FUNCTIONAL TESTS:  {Functional tests:24029}  GAIT: Distance  walked: *** Assistive device utilized: {Assistive devices:23999} Level of assistance: {Levels of assistance:24026} Comments: ***                                                                                                                                TREATMENT DATE: ***    PATIENT EDUCATION:  Education details: *** Person educated: {Person educated:25204} Education method: {Education Method:25205} Education comprehension: {Education Comprehension:25206}  HOME  EXERCISE PROGRAM: ***  ASSESSMENT:  CLINICAL IMPRESSION: Patient is a 23 y.o. male who was seen today for physical therapy evaluation and treatment for ***.   OBJECTIVE IMPAIRMENTS: Abnormal gait, cardiopulmonary status limiting activity, decreased activity tolerance, decreased balance, decreased coordination, decreased endurance, decreased mobility, difficulty walking, decreased ROM, decreased strength, increased muscle spasms, impaired flexibility, improper body mechanics, and pain.   REHAB POTENTIAL: Good  CLINICAL DECISION MAKING: Stable/uncomplicated  EVALUATION COMPLEXITY: Low   GOALS: Goals reviewed with patient? Yes  SHORT TERM GOALS: Target date: 10/18/23 Independent with initial HEP Baseline: Goal status: INITIAL   LONG TERM GOALS: Target date: 01/04/24  Independent with advanced HEP Baseline:  Goal status: INITIAL  2.  *** Baseline:  Goal status: INITIAL  3.  *** Baseline:  Goal status: INITIAL  4.  *** Baseline:  Goal status: INITIAL  5.  *** Baseline:  Goal status: INITIAL  6.  *** Baseline:  Goal status: INITIAL   PLAN:  PT FREQUENCY: 1-2x/week  PT DURATION: 12 weeks  PLANNED INTERVENTIONS: 97164- PT Re-evaluation, 97110-Therapeutic exercises, 97530- Therapeutic activity, 97112- Neuromuscular re-education, 97535- Self Care, 47425- Manual therapy, [id number]- Gait training, [phone]- Electrical stimulation (unattended), 97035- Ultrasound, Patient/Family education, Balance training, Stair training, Taping, Dry Needling, Joint mobilization, Cryotherapy, and Moist heat  PLAN FOR NEXT SESSION: Hollis Lurie, PT 10/04/2023, 7:45 AM

## 2023-10-18 ENCOUNTER — Telehealth: Payer: Self-pay | Admitting: Family Medicine

## 2023-10-18 DIAGNOSIS — S3981XD Other specified injuries of abdomen, subsequent encounter: Secondary | ICD-10-CM

## 2023-10-18 NOTE — Telephone Encounter (Signed)
 Patient called and was supposed to have his surgery but he was going to wait and he has decided to do it and wants to do it in August now. He is asking Dr. Alease Hunter to send him to a doctor to do surgery for his sports hernia. Please advise.

## 2023-10-18 NOTE — Telephone Encounter (Signed)
 Referral placed to [facility] for Sports Hernia.

## 2023-10-18 NOTE — Addendum Note (Signed)
 Addended by: Charle Congo on: 10/18/2023 03:01 PM   Modules accepted: Orders

## 2023-10-19 NOTE — Telephone Encounter (Signed)
 Called pt at [phone] and left VM advising that requested referral has been placed and to call the office with any questions.

## 2023-10-19 NOTE — Telephone Encounter (Signed)
 General surgery referral placed today for surgical consultation for sports hernia.

## 2023-10-19 NOTE — Addendum Note (Signed)
 Addended by: Syliva Even on: 10/19/2023 06:55 AM   Modules accepted: Orders

## 2023-11-14 ENCOUNTER — Ambulatory Visit: Payer: Self-pay | Admitting: Surgery

## 2023-11-14 DIAGNOSIS — R1031 Right lower quadrant pain: Secondary | ICD-10-CM | POA: Diagnosis not present

## 2023-11-14 DIAGNOSIS — S3981XA Other specified injuries of abdomen, initial encounter: Secondary | ICD-10-CM | POA: Diagnosis not present

## 2023-12-02 DIAGNOSIS — Z0120 Encounter for dental examination and cleaning without abnormal findings: Secondary | ICD-10-CM | POA: Diagnosis not present

## 2024-06-02 ENCOUNTER — Other Ambulatory Visit: Payer: Self-pay

## 2024-06-02 ENCOUNTER — Emergency Department (HOSPITAL_BASED_OUTPATIENT_CLINIC_OR_DEPARTMENT_OTHER)
Admission: EM | Admit: 2024-06-02 | Discharge: 2024-06-02 | Disposition: A | Discharge status: Home / Self Care | Attending: Emergency Medicine | Admitting: Emergency Medicine

## 2024-06-02 DIAGNOSIS — R21 Rash and other nonspecific skin eruption: Secondary | ICD-10-CM | POA: Diagnosis present

## 2024-06-02 DIAGNOSIS — L509 Urticaria, unspecified: Secondary | ICD-10-CM | POA: Diagnosis not present

## 2024-06-02 MED ORDER — FAMOTIDINE IN NACL 20-0.9 MG/50ML-% IV SOLN
20.0000 mg | Freq: Once | INTRAVENOUS | Status: AC
  Administered 2024-06-02: 20 mg via INTRAVENOUS
  Filled 2024-06-02: qty 50

## 2024-06-02 MED ORDER — DIPHENHYDRAMINE HCL 25 MG PO TABS: 25.0000 mg | ORAL_TABLET | Freq: Four times a day (QID) | ORAL | 0 refills | Status: AC | PRN

## 2024-06-02 MED ORDER — DIPHENHYDRAMINE HCL 50 MG/ML IJ SOLN
25.0000 mg | Freq: Once | INTRAMUSCULAR | Status: AC
  Administered 2024-06-02: 25 mg via INTRAVENOUS
  Filled 2024-06-02: qty 1

## 2024-06-02 MED ORDER — FAMOTIDINE 20 MG PO TABS: 20.0000 mg | ORAL_TABLET | Freq: Every day | ORAL | 0 refills | Status: AC

## 2024-06-02 MED ORDER — PREDNISONE 20 MG PO TABS: 40.0000 mg | ORAL_TABLET | Freq: Every day | ORAL | 0 refills | Status: AC

## 2024-06-02 MED ORDER — METHYLPREDNISOLONE SODIUM SUCC 125 MG IJ SOLR
125.0000 mg | Freq: Once | INTRAMUSCULAR | Status: AC
  Administered 2024-06-02: 125 mg via INTRAVENOUS
  Filled 2024-06-02: qty 2

## 2024-06-02 NOTE — ED Provider Notes (Signed)
 " [place] [facility]GENC[facility] DEPARTMENT AT [facility] Provider Note   CSN: [id number] Arrival date & time: 06/02/24  0011     Patient presents with: Allergic Reaction   Roy Zavala is a 24 y.o. male.   HPI     This is a 24 year old male who presents with concern for itchy rash.  Patient reports that he noted after working out tonight that he began to have a rash over his arms, torso, legs.  He states he had not felt very well today and only ate 1 meal.  He has had some acid reflux.  He states that at the time of rash onset he has not had any new foods, medications, lotions, detergents.  Does report some shortness of breath.  Patient was seen and evaluated in triage by PA.  He was given Pepcid , Benadryl , and steroids.  Symptoms have completely resolved.  Prior to Admission medications  Medication Sig Start Date End Date Taking? Authorizing Provider  diphenhydrAMINE  (BENADRYL ) 25 MG tablet Take 1 tablet (25 mg total) by mouth every 6 (six) hours as needed. 06/02/24  Yes Zamauri Nez, Charmaine FALCON, MD  famotidine  (PEPCID ) 20 MG tablet Take 1 tablet (20 mg total) by mouth daily. 06/02/24  Yes Iain Sawchuk, Charmaine FALCON, MD  predniSONE  (DELTASONE ) 20 MG tablet Take 2 tablets (40 mg total) by mouth daily. 06/02/24  Yes Claire Dolores, Charmaine FALCON, MD  gabapentin  (NEURONTIN ) 600 MG tablet Take 0.5 tablets (300 mg total) by mouth at bedtime. TAKE ONLY HALF A TABLET 10/10/20 11/09/20  Fleming, Zelda W, NP  lidocaine  (LIDODERM ) 5 % Place 1 patch onto the skin daily. Remove & Discard patch within 12 hours or as directed by MD 10/10/20   Fleming, Zelda W, NP  meloxicam  (MOBIC ) 15 MG tablet Take 1 tablet (15 mg total) by mouth daily as needed for up to 60 doses for pain. 08/29/23   Joane Artist RAMAN, MD  tiZANidine  (ZANAFLEX ) 4 MG tablet Take 1 tablet (4 mg total) by mouth every 6 (six) hours as needed for muscle spasms. 09/01/21   Corey, Evan S, MD  Vitamin D , Ergocalciferol , (DRISDOL ) 1.25 MG (50000 UNIT) CAPS capsule Take 1  capsule (50,000 Units total) by mouth every 7 (seven) days. 10/10/20   Fleming, Zelda W, NP    Allergies: Patient has no known allergies.    Review of Systems  Constitutional:  Negative for fever.  Respiratory:  Positive for shortness of breath.   Skin:  Positive for rash.  All other systems reviewed and are negative.   Updated Vital Signs BP 114/74   Pulse 73   Temp 98.9 F (37.2 C)   Resp 18   Ht 1.727 m (5' 8)   Wt 68 kg   SpO2 97%   BMI 22.81 kg/m   Physical Exam Vitals and nursing note reviewed.  Constitutional:      Appearance: He is well-developed. He is not ill-appearing.  HENT:     Head: Normocephalic and atraumatic.     Nose: Nose normal.     Mouth/Throat:     Mouth: Mucous membranes are moist.     Comments: Clear, uvula midline, no swelling noted Eyes:     Pupils: Pupils are equal, round, and reactive to light.  Cardiovascular:     Rate and Rhythm: Normal rate and regular rhythm.     Heart sounds: Normal heart sounds. No murmur heard. Pulmonary:     Effort: Pulmonary effort is normal. No respiratory distress.     Breath  sounds: Normal breath sounds. No wheezing.  Abdominal:     General: Bowel sounds are normal.     Palpations: Abdomen is soft.     Tenderness: There is no abdominal tenderness. There is no rebound.  Musculoskeletal:     Cervical back: Neck supple.  Lymphadenopathy:     Cervical: No cervical adenopathy.  Skin:    General: Skin is warm and dry.  Neurological:     Mental Status: He is alert and oriented to person, place, and time.  Psychiatric:        Mood and Affect: Mood normal.     (all labs ordered are listed, but only abnormal results are displayed) Labs Reviewed - No data to display  EKG: None  Radiology: No results found.   Procedures   Medications Ordered in the ED  methylPREDNISolone  sodium succinate (SOLU-MEDROL ) 125 mg/2 mL injection 125 mg (125 mg Intravenous Given 06/02/24 0045)  diphenhydrAMINE  (BENADRYL )  injection 25 mg (25 mg Intravenous Given 06/02/24 0045)  famotidine  (PEPCID ) IVPB 20 mg premix (0 mg Intravenous Stopped 06/02/24 0115)                                    Medical Decision Making Risk OTC drugs. Prescription drug management.   This patient presents to the ED for concern of rash, itching, this involves an extensive number of treatment options, and is a complaint that carries with it a high risk of complications and morbidity.  I considered the following differential and admission for this acute, potentially life threatening condition.  The differential diagnosis includes hives, anaphylaxis, allergic reaction  MDM:    This is a 24 year old male who presents with what sounds like hives.  Was seen by APP in triage.  Based on note and pictures available, patient had urticarial rash consistent with hives.  He did endorse some shortness of breath but no wheezing.  Was not given an EpiPen  and has fully defervesced upon my evaluation.  Breath sounds are clear.  No ongoing rash.  No oropharyngeal swelling.  Unclear culprit of why patient had hives.  He was monitored without recurrence of symptoms.  Will send home with Benadryl , Pepcid , and a short burst of steroids.  (Labs, imaging, consults)  Labs: I Ordered, and personally interpreted labs.  The pertinent results include: None  Imaging Studies ordered: I ordered imaging studies including none I independently visualized and interpreted imaging. I agree with the radiologist interpretation  Additional history obtained from chart review.  External records from outside source obtained and reviewed including prior evaluations  Cardiac Monitoring: The patient was maintained on a cardiac monitor.  If on the cardiac monitor, I personally viewed and interpreted the cardiac monitored which showed an underlying rhythm of: Sinus  Reevaluation: After the interventions noted above, I reevaluated the patient and found that they have  :resolved  Social Determinants of Health:  lives independently  Disposition: Discharge  Co morbidities that complicate the patient evaluation  Past Medical History:  Diagnosis Date   Acid reflux    occasional - no current med.   Deviated septum 12/2016   Nasal fracture 12/2016   nasal congestion due to fx., per father     Medicines Meds ordered this encounter  Medications   methylPREDNISolone  sodium succinate (SOLU-MEDROL ) 125 mg/2 mL injection 125 mg    IV methylprednisolone  will be converted to either a q12h or q24h frequency with the same  total daily dose (TDD).  Ordered Dose: 1 to 125 mg TDD; convert to: TDD q24h.  Ordered Dose: 126 to 250 mg TDD; convert to: TDD div q12h.  Ordered Dose: >250 mg TDD; DAW.   diphenhydrAMINE  (BENADRYL ) injection 25 mg   famotidine  (PEPCID ) IVPB 20 mg premix   diphenhydrAMINE  (BENADRYL ) 25 MG tablet    Sig: Take 1 tablet (25 mg total) by mouth every 6 (six) hours as needed.    Dispense:  30 tablet    Refill:  0   famotidine  (PEPCID ) 20 MG tablet    Sig: Take 1 tablet (20 mg total) by mouth daily.    Dispense:  30 tablet    Refill:  0   predniSONE  (DELTASONE ) 20 MG tablet    Sig: Take 2 tablets (40 mg total) by mouth daily.    Dispense:  10 tablet    Refill:  0    I have reviewed the patients home medicines and have made adjustments as needed  Problem List / ED Course: Problem List Items Addressed This Visit   None Visit Diagnoses       Hives    -  Primary                Final diagnoses:  Hives    ED Discharge Orders          Ordered    diphenhydrAMINE  (BENADRYL ) 25 MG tablet  Every 6 hours PRN        06/02/24 0334    famotidine  (PEPCID ) 20 MG tablet  Daily        06/02/24 0334    predniSONE  (DELTASONE ) 20 MG tablet  Daily        06/02/24 0334               Roy Charmaine FALCON, MD 06/02/24 [phone]  "

## 2024-06-02 NOTE — ED Triage Notes (Addendum)
 Pt arrives POV with complaints of shortness breath, itching all over with hives on bil arms and flank that started 3 hours prior to arrival. States nothing has changed in his routine today

## 2024-06-02 NOTE — ED Provider Triage Note (Signed)
 Emergency Medicine Provider Triage Evaluation Note  Roy Zavala , a 24 y.o. male  was evaluated in triage.  Pt complains of an itchy rash that began over his arms, torso, and legs just prior to arrival.  Reports that he also feels like he is getting short of breath.  Denies any known allergies.  Review of Systems  Positive: As above Negative: As above  Physical Exam  BP 132/88   Pulse 77   Temp 98.9 F (37.2 C)   Resp 18   Ht 5' 8 (1.727 m)   Wt 68 kg   SpO2 100%   BMI 22.81 kg/m  Gen:   Awake, no distress   Resp:  Normal effort  MSK:   Moves extremities without difficulty  Other:  Urticarial rash on the torso, arms, and legs.  Talking in full sentences without difficulty.  No stridor.  Lungs clear to auscultation bilaterally.  No edema of the lips, tongue, or posterior pharynx.       Medical Decision Making  Medically screening exam initiated at 12:37 AM.  Appropriate orders placed.  Fynn Vanblarcom was informed that the remainder of the evaluation will be completed by another provider, this initial triage assessment does not replace that evaluation, and the importance of remaining in the ED until their evaluation is complete.  Symptoms seem consistent with allergic reaction, but no signs of anaphylaxis at this time.  Placed orders for medications (IV methylprednisolone , Solu-Medrol , and famotidine ) in triage    Veta Palma, PA-C 06/02/24 0038

## 2024-06-02 NOTE — Discharge Instructions (Signed)
 You were seen today for itchy rash.  This is likely hives.  Sometimes this can be related to an allergic reaction, anxiety, stress, viral illness.  Take medications as prescribed.
